# Patient Record
Sex: Female | Born: 1975 | Race: White | Hispanic: No | Smoking: Never smoker
Health system: Southern US, Community
[De-identification: ages and names within clinical notes are randomized; demographics above are authoritative.]

## PROBLEM LIST (undated history)

## (undated) DIAGNOSIS — F419 Anxiety disorder, unspecified: Secondary | ICD-10-CM

## (undated) DIAGNOSIS — C801 Malignant (primary) neoplasm, unspecified: Secondary | ICD-10-CM

## (undated) DIAGNOSIS — I10 Essential (primary) hypertension: Secondary | ICD-10-CM

## (undated) DIAGNOSIS — T7840XA Allergy, unspecified, initial encounter: Secondary | ICD-10-CM

## (undated) DIAGNOSIS — F32A Depression, unspecified: Secondary | ICD-10-CM

## (undated) DIAGNOSIS — K219 Gastro-esophageal reflux disease without esophagitis: Secondary | ICD-10-CM

## (undated) HISTORY — DX: Anxiety disorder, unspecified: F41.9

## (undated) HISTORY — PX: DILATION AND CURETTAGE OF UTERUS: SHX78

## (undated) HISTORY — DX: Allergy, unspecified, initial encounter: T78.40XA

## (undated) HISTORY — DX: Depression, unspecified: F32.A

## (undated) HISTORY — DX: Essential (primary) hypertension: I10

## (undated) HISTORY — DX: Malignant (primary) neoplasm, unspecified: C80.1

## (undated) HISTORY — DX: Gastro-esophageal reflux disease without esophagitis: K21.9

## (undated) HISTORY — PX: TONSILLECTOMY: SUR1361

---

## 2020-07-14 DIAGNOSIS — R000 Tachycardia, unspecified: Secondary | ICD-10-CM | POA: Insufficient documentation

## 2021-10-25 ENCOUNTER — Other Ambulatory Visit: Payer: Self-pay

## 2021-10-25 ENCOUNTER — Emergency Department (HOSPITAL_BASED_OUTPATIENT_CLINIC_OR_DEPARTMENT_OTHER): Payer: 59

## 2021-10-25 ENCOUNTER — Encounter (HOSPITAL_BASED_OUTPATIENT_CLINIC_OR_DEPARTMENT_OTHER): Payer: Self-pay

## 2021-10-25 ENCOUNTER — Emergency Department (HOSPITAL_BASED_OUTPATIENT_CLINIC_OR_DEPARTMENT_OTHER)
Admission: EM | Admit: 2021-10-25 | Discharge: 2021-10-25 | Disposition: A | Discharge status: Home / Self Care | Payer: 59 | Attending: Emergency Medicine | Admitting: Emergency Medicine

## 2021-10-25 DIAGNOSIS — R0789 Other chest pain: Secondary | ICD-10-CM | POA: Diagnosis not present

## 2021-10-25 DIAGNOSIS — R002 Palpitations: Secondary | ICD-10-CM | POA: Insufficient documentation

## 2021-10-25 LAB — CBC
HCT: 37.5 % (ref 36.0–46.0)
Hemoglobin: 12.7 g/dL (ref 12.0–15.0)
MCH: 30.3 pg (ref 26.0–34.0)
MCHC: 33.9 g/dL (ref 30.0–36.0)
MCV: 89.5 fL (ref 80.0–100.0)
Platelets: 373 10*3/uL (ref 150–400)
RBC: 4.19 MIL/uL (ref 3.87–5.11)
RDW: 12.1 % (ref 11.5–15.5)
WBC: 5.5 10*3/uL (ref 4.0–10.5)
nRBC: 0 % (ref 0.0–0.2)

## 2021-10-25 LAB — BASIC METABOLIC PANEL
Anion gap: 5 (ref 5–15)
BUN: 8 mg/dL (ref 6–20)
CO2: 28 mmol/L (ref 22–32)
Calcium: 8.8 mg/dL — ABNORMAL LOW (ref 8.9–10.3)
Chloride: 104 mmol/L (ref 98–111)
Creatinine, Ser: 0.65 mg/dL (ref 0.44–1.00)
GFR, Estimated: >60 mL/min (ref >60)
Glucose, Bld: 99 mg/dL (ref 70–99)
Potassium: 3.9 mmol/L (ref 3.5–5.1)
Sodium: 137 mmol/L (ref 135–145)

## 2021-10-25 LAB — TROPONIN I (HIGH SENSITIVITY)
Troponin I (High Sensitivity): 4 ng/L (ref <18)
Troponin I (High Sensitivity): 4 ng/L (ref <18)

## 2021-10-25 LAB — PREGNANCY, URINE: Preg Test, Ur: NEGATIVE

## 2021-10-25 MED ORDER — HYDROXYZINE HCL 25 MG PO TABS: 25.0000 mg | ORAL_TABLET | Freq: Four times a day (QID) | ORAL | 0 refills | Status: DC

## 2021-10-25 NOTE — ED Triage Notes (Signed)
Pt states has been having tachycardia for the past year. States has also been hypertensive the past few weeks. Recently moved from [place], has appt with PCP in December. C/o dyspnea, palpitations, left arm/neck pain.

## 2021-10-25 NOTE — ED Provider Notes (Signed)
[facility] EMERGENCY DEPARTMENT Provider Note   CSN: [id number] Arrival date & time: 10/25/21  1556     History Chief Complaint  Patient presents with   Palpitations    Tiffany Zimmerman is a 45 y.o. female.  HPI Patient is a 44 year old female with past medical history significant for depression on Effexor  Patient is presented to the ER today with complaints of episodic palpitations she states that she has had the symptoms for over a year she states that she was evaluated by a doctor in [place] where she just moved from however she is recently relocated to [facility].  She states that she has these episodes every few months.  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  States that she did feel somewhat lightheaded but did not feel that she was about to pass out.  She states she has no symptoms currently.  She states her symptoms lasted for 10 to 15 minutes during this episode.  She has had similar episodes in the past.  She has a appointment in December to see a primary care provider about the symptoms.  She states that she last had her thyroid levels checked approximately 2 or 3 years ago.  They were normal at that time.  She does state that she feels that her heart rate has been as high as 112 over the past year relatively consistently.  She states that this fast heart rate seems to be associated with some anxiety although she is not sure if it is causing it or the result of it.    History reviewed. No pertinent past medical history.  There are no problems to display for this patient.   Past Surgical History:  Procedure Laterality Date   DILATION AND CURETTAGE OF UTERUS     TONSILLECTOMY       OB History   No obstetric history on file.     History reviewed. No pertinent family  history.  Social History   Tobacco Use   Smoking status: Never   Smokeless tobacco: Never  Vaping Use   Vaping Use: Never used  Substance Use Topics   Alcohol use: Yes    Comment: occassional   Drug use: Never    Home Medications Prior to Admission medications   Medication Sig Start Date End Date Taking? Authorizing Provider  hydrOXYzine (ATARAX/VISTARIL) 25 MG tablet Take 1 tablet (25 mg total) by mouth every 6 (six) hours for 21 days. 10/25/21 11/15/21 Yes Tedd Sias, PA    Allergies    Patient has no known allergies.  Review of Systems   Review of Systems  Constitutional:  Positive for fatigue. Negative for chills and fever.  HENT:  Negative for congestion.   Eyes:  Negative for pain.  Respiratory:  Positive for shortness of breath. Negative for cough.   Cardiovascular:  Positive for palpitations. Negative for chest pain and leg swelling.  Gastrointestinal:  Negative for abdominal pain and vomiting.  Genitourinary:  Negative for dysuria.  Musculoskeletal:  Negative for myalgias.  Skin:  Negative for rash.  Neurological:  Negative for dizziness and headaches.   Physical Exam Updated Vital Signs BP 131/87   Pulse 86   Temp 97.7 F (36.5 C) (Oral)   Resp 16   Ht 5\' 2"  (1.575 m)   Wt 95.3  kg   LMP 10/11/2021 (Approximate)   SpO2 100%   BMI 38.41 kg/m   Physical Exam Vitals and nursing note reviewed.  Constitutional:      General: She is not in acute distress.    Comments: Pleasant well-appearing 45 year old.  In no acute distress.  Sitting comfortably in bed.  Able answer questions appropriately follow commands. No increased work of breathing. Speaking in full sentences.   HENT:     Head: Normocephalic and atraumatic.     Nose: Nose normal.  Eyes:     General: No scleral icterus. Cardiovascular:     Rate and Rhythm: Normal rate and regular rhythm.     Pulses: Normal pulses.     Heart sounds: Normal heart sounds.     Comments: RRR, no  MRG Pulmonary:     Effort: Pulmonary effort is normal. No respiratory distress.     Breath sounds: No wheezing.  Abdominal:     Palpations: Abdomen is soft.     Tenderness: There is no abdominal tenderness. There is no guarding or rebound.  Musculoskeletal:     Cervical back: Normal range of motion.     Right lower leg: No edema.     Left lower leg: No edema.     Comments: No lower extremity edema or calf tenderness  Skin:    General: Skin is warm and dry.     Capillary Refill: Capillary refill takes less than 2 seconds.  Neurological:     Mental Status: She is alert. Mental status is at baseline.  Psychiatric:        Mood and Affect: Mood normal.        Behavior: Behavior normal.    ED Results / Procedures / Treatments   Labs (all labs ordered are listed, but only abnormal results are displayed) Labs Reviewed  BASIC METABOLIC PANEL - Abnormal; Notable for the following components:      Result Value   Calcium 8.8 (*)    All other components within normal limits  CBC  PREGNANCY, URINE  TROPONIN I (HIGH SENSITIVITY)  TROPONIN I (HIGH SENSITIVITY)    EKG EKG Interpretation  Date/Time:  Thursday October 25 2021 16:06:11 EST Ventricular Rate:  94 PR Interval:  136 QRS Duration: 82 QT Interval:  344 QTC Calculation: 430 R Axis:   51 Text Interpretation: Normal sinus rhythm Cannot rule out Anterior infarct , age undetermined No previous tracing Confirmed by Blanchie Dessert [phone]) on 10/25/2021 5:40:42 PM  Radiology DG Chest 2 View  Result Date: 10/25/2021 CLINICAL DATA:  Dyspnea EXAM: CHEST - 2 VIEW COMPARISON:  None. FINDINGS: The heart size and mediastinal contours are within normal limits. Both lungs are clear. The visualized skeletal structures are unremarkable. IMPRESSION: No acute abnormality of the lungs. Electronically Signed   By: Delanna Ahmadi M.D.   On: 10/25/2021 16:46    Procedures Procedures   Medications Ordered in ED Medications - No data to  display  ED Course  I have reviewed the triage vital signs and the nursing notes.  Pertinent labs & imaging results that were available during my care of the patient were reviewed by me and considered in my medical decision making (see chart for details).    MDM Rules/Calculators/A&P                          Patient is a 45 year old female presenting today with complaints of intermittent palpitations, feeling hot, flushed, somewhat fatigued and  having tachycardia over the past year.  She states she had an episode of this this morning when she was working she states she feels that she was very stressed at work prior to her symptom onset  She states she did have some chest tightness and "twinges" in her chest   I personally reviewed all laboratory work and imaging.  Metabolic panel without any acute abnormality specifically kidney function within normal limits and no significant electrolyte abnormalities. CBC without leukocytosis or significant anemia.   Troponin x2 WNLs; CXR WNLs  EKG was normal sinus rhythm.  No significant axis deviations.  No evidence of ischemia.  Reviewed by Dr. Maryan Rued.  As patient has been asymptomatic during her ER visits, well-appearing has vital signs within normal limits plan to discharge home.  She was initially somewhat hypertensive.  Doubt PE as patient denies pleuritic component of CP, denies history of clot, active cancer, immobilization, surgery, hemoptysis, known clotting disorder, is on no estrogen containing medications, denies calf pain and on physical exam has no unilateral leg swelling, calf TTP, tachycardia or hypoxia.   Doubt ACS as patient has (DM, HLD, HTN, obesity, CAD/CVA, family hx, smoking) risk factors. Patient's CP is atypical for ACS. Is non-radiating, not substernal, not described as chest pressure and is not precipitated/worsened by exertion.  Doubt thoracic aortic dissection as pain lacks tearing or severe quality and does not  radiate to back. Patient denies nausea, diaphoresis. Symmetric pulses, no murmur, no neurologic deficit. CXR without widened mediastinum.   Doubt pericarditis as no recent illness, PE without muffled heart sounds or friction rub, CP is non-pleuritic, EKG without global STE or PR depression.  Ultimately overall well-appearing on exam.  Will discharge home at this time.  Return precautions given.  Given Vistaril for home use as needed.  Final Clinical Impression(s) / ED Diagnoses Final diagnoses:  Palpitations  Other chest pain    Rx / DC Orders ED Discharge Orders          Ordered    hydrOXYzine (ATARAX/VISTARIL) 25 MG tablet  Every 6 hours        10/25/21 1901             Tedd Sias, [place] 10/25/21 1911    Blanchie Dessert, MD 10/25/21 2236

## 2021-10-25 NOTE — Discharge Instructions (Addendum)
Please follow-up with your primary care provider.  I given you a work note for the weekend so you may rest tomorrow.  Drink plenty of water.  I have written your prescription for a medication called hydroxyzine that you may take in addition to the Effexor that you are taking.  This can help with some situational anxiety in case this is causing some of your symptoms.  It is a relatively benign medication a few side effects although does cause some dry mouth you may choose to take it or not take it.

## 2021-10-25 NOTE — ED Notes (Signed)
ED Provider at bedside. 

## 2021-11-14 ENCOUNTER — Ambulatory Visit (INDEPENDENT_AMBULATORY_CARE_PROVIDER_SITE_OTHER): Payer: 59 | Admitting: Nurse Practitioner

## 2021-11-14 ENCOUNTER — Other Ambulatory Visit: Payer: Self-pay

## 2021-11-14 ENCOUNTER — Encounter: Payer: Self-pay | Admitting: Nurse Practitioner

## 2021-11-14 VITALS — BP 144/94 | HR 101 | Temp 96.9°F | Ht 63.75 in | Wt 207.8 lb

## 2021-11-14 DIAGNOSIS — I10 Essential (primary) hypertension: Secondary | ICD-10-CM | POA: Diagnosis not present

## 2021-11-14 DIAGNOSIS — R002 Palpitations: Secondary | ICD-10-CM | POA: Diagnosis not present

## 2021-11-14 DIAGNOSIS — Z86006 Personal history of melanoma in-situ: Secondary | ICD-10-CM | POA: Insufficient documentation

## 2021-11-14 DIAGNOSIS — Z23 Encounter for immunization: Secondary | ICD-10-CM

## 2021-11-14 DIAGNOSIS — F3341 Major depressive disorder, recurrent, in partial remission: Secondary | ICD-10-CM

## 2021-11-14 DIAGNOSIS — F32A Depression, unspecified: Secondary | ICD-10-CM | POA: Insufficient documentation

## 2021-11-14 MED ORDER — AMLODIPINE BESYLATE 5 MG PO TABS: 5.0000 mg | ORAL_TABLET | Freq: Every day | ORAL | 5 refills | Status: DC

## 2021-11-14 NOTE — Assessment & Plan Note (Signed)
First noticed 73yr ago, associated with intermittent palpitations Home BP: 150/106, 140/90 Fhx of HTN (mother). 1cup of coffee, 20oz starbucks cold brew, coke zero 1can per day. ETOh: social use, 1xmonth No tobacco use. No marijuana use. She is not sexually active, HCG test completed 10/25/21 (negative) CBC and BMP completed in ED on 10/25/21: normal.  Start DASH diet and daily exercise. Decrease caffeine. Maintain adequate oral hydration Start amlodipine 5mg  at bedtime Monitor BP in AM 2-3x/week. F/up in 25month

## 2021-11-14 NOTE — Assessment & Plan Note (Signed)
Chronic, waxing and waning. Current Use of effexor for anxiety and depression 150mg  x 52yr. Previous use of zoloft, d/c due to lack of improvement.   Maintain current dose F/up in 52month

## 2021-11-14 NOTE — Patient Instructions (Addendum)
Thank you for choosing [facility]  Sign medical release form to get your records from previous providers.  Start DASH diet and daily exercise. Decrease caffeine. Maintain adequate oral hydration  Start amlodipine 5mg  at bedtime Monitor BP in AM 2-3x/week.  DASH Eating Plan DASH stands for Dietary Approaches to Stop Hypertension. The DASH eating plan is a healthy eating plan that has been shown to: Reduce high blood pressure (hypertension). Reduce your risk for type 2 diabetes, heart disease, and stroke. Help with weight loss. What are tips for following this plan? Reading food labels Check food labels for the amount of salt (sodium) per serving. Choose foods with less than 5 percent of the Daily Value of sodium. Generally, foods with less than 300 milligrams (mg) of sodium per serving fit into this eating plan. To find whole grains, look for the word "whole" as the first word in the ingredient list. Shopping Buy products labeled as "low-sodium" or "no salt added." Buy fresh foods. Avoid canned foods and pre-made or frozen meals. Cooking Avoid adding salt when cooking. Use salt-free seasonings or herbs instead of table salt or sea salt. Check with your health care provider or pharmacist before using salt substitutes. Do not fry foods. Cook foods using healthy methods such as baking, boiling, grilling, roasting, and broiling instead. Cook with heart-healthy oils, such as olive, canola, avocado, soybean, or sunflower oil. Meal planning  Eat a balanced diet that includes: 4 or more servings of fruits and 4 or more servings of vegetables each day. Try to fill one-half of your plate with fruits and vegetables. 6-8 servings of whole grains each day. Less than 6 oz (170 g) of lean meat, poultry, or fish each day. A 3-oz (85-g) serving of meat is about the same size as a deck of cards. One egg equals 1 oz (28 g). 2-3 servings of low-fat dairy each day. One serving is 1 cup (237  mL). 1 serving of nuts, seeds, or beans 5 times each week. 2-3 servings of heart-healthy fats. Healthy fats called omega-3 fatty acids are found in foods such as walnuts, flaxseeds, fortified milks, and eggs. These fats are also found in cold-water fish, such as sardines, salmon, and mackerel. Limit how much you eat of: Canned or prepackaged foods. Food that is high in trans fat, such as some fried foods. Food that is high in saturated fat, such as fatty meat. Desserts and other sweets, sugary drinks, and other foods with added sugar. Full-fat dairy products. Do not salt foods before eating. Do not eat more than 4 egg yolks a week. Try to eat at least 2 vegetarian meals a week. Eat more home-cooked food and less restaurant, buffet, and fast food. Lifestyle When eating at a restaurant, ask that your food be prepared with less salt or no salt, if possible. If you drink alcohol: Limit how much you use to: 0-1 drink a day for women who are not pregnant. 0-2 drinks a day for men. Be aware of how much alcohol is in your drink. In the [place] one drink equals one 12 oz bottle of beer (355 mL), one 5 oz glass of wine (148 mL), or one 1 oz glass of hard liquor (44 mL). General information Avoid eating more than 2,300 mg of salt a day. If you have hypertension, you may need to reduce your sodium intake to 1,500 mg a day. Work with your health care provider to maintain a healthy body weight or to lose weight. Ask  what an ideal weight is for you. Get at least 30 minutes of exercise that causes your heart to beat faster (aerobic exercise) most days of the week. Activities may include walking, swimming, or biking. Work with your health care provider or dietitian to adjust your eating plan to your individual calorie needs. What foods should I eat? Fruits All fresh, dried, or frozen fruit. Canned fruit in natural juice (without added sugar). Vegetables Fresh or frozen vegetables (raw, steamed, roasted,  or grilled). Low-sodium or reduced-sodium tomato and vegetable juice. Low-sodium or reduced-sodium tomato sauce and tomato paste. Low-sodium or reduced-sodium canned vegetables. Grains Whole-grain or whole-wheat bread. Whole-grain or whole-wheat pasta. Brown rice. [place]. Bulgur. Whole-grain and low-sodium cereals. Pita bread. Low-fat, low-sodium crackers. Whole-wheat flour tortillas. Meats and other proteins Skinless chicken or [place]. Ground chicken or [place]. Pork with fat trimmed off. Fish and seafood. Egg whites. Dried beans, peas, or lentils. Unsalted nuts, nut butters, and seeds. Unsalted canned beans. Lean cuts of beef with fat trimmed off. Low-sodium, lean precooked or cured meat, such as sausages or meat loaves. Dairy Low-fat (1%) or fat-free (skim) milk. Reduced-fat, low-fat, or fat-free cheeses. Nonfat, low-sodium ricotta or cottage cheese. Low-fat or nonfat yogurt. Low-fat, low-sodium cheese. Fats and oils Soft margarine without trans fats. Vegetable oil. Reduced-fat, low-fat, or light mayonnaise and salad dressings (reduced-sodium). Canola, safflower, olive, avocado, soybean, and sunflower oils. Avocado. Seasonings and condiments Herbs. Spices. Seasoning mixes without salt. Other foods Unsalted popcorn and pretzels. Fat-free sweets. The items listed above may not be a complete list of foods and beverages you can eat. Contact a dietitian for more information. What foods should I avoid? Fruits Canned fruit in a light or heavy syrup. Fried fruit. Fruit in cream or butter sauce. Vegetables Creamed or fried vegetables. Vegetables in a cheese sauce. Regular canned vegetables (not low-sodium or reduced-sodium). Regular canned tomato sauce and paste (not low-sodium or reduced-sodium). Regular tomato and vegetable juice (not low-sodium or reduced-sodium). Angie Fava. Olives. Grains Baked goods made with fat, such as croissants, muffins, or some breads. Dry pasta or rice meal  packs. Meats and other proteins Fatty cuts of meat. Ribs. Fried meat. Berniece Salines. Bologna, salami, and other precooked or cured meats, such as sausages or meat loaves. Fat from the back of a pig (fatback). Bratwurst. Salted nuts and seeds. Canned beans with added salt. Canned or smoked fish. Whole eggs or egg yolks. Chicken or [place] with skin. Dairy Whole or 2% milk, cream, and half-and-half. Whole or full-fat cream cheese. Whole-fat or sweetened yogurt. Full-fat cheese. Nondairy creamers. Whipped toppings. Processed cheese and cheese spreads. Fats and oils Butter. Stick margarine. Lard. Shortening. Ghee. Bacon fat. Tropical oils, such as coconut, palm kernel, or palm oil. Seasonings and condiments Onion salt, garlic salt, seasoned salt, table salt, and sea salt. Worcestershire sauce. Tartar sauce. Barbecue sauce. Teriyaki sauce. Soy sauce, including reduced-sodium. Steak sauce. Canned and packaged gravies. Fish sauce. Oyster sauce. Cocktail sauce. Store-bought horseradish. Ketchup. Mustard. Meat flavorings and tenderizers. Bouillon cubes. Hot sauces. Pre-made or packaged marinades. Pre-made or packaged taco seasonings. Relishes. Regular salad dressings. Other foods Salted popcorn and pretzels. The items listed above may not be a complete list of foods and beverages you should avoid. Contact a dietitian for more information. Where to find more information National Heart, Lung, and Blood Institute: https://wilson-eaton.com/ American Heart Association: www.heart.org Academy of Nutrition and Dietetics: www.eatright.[place]: www.kidney.org Summary The DASH eating plan is a healthy eating plan that has been shown to reduce  high blood pressure (hypertension). It may also reduce your risk for type 2 diabetes, heart disease, and stroke. When on the DASH eating plan, aim to eat more fresh fruits and vegetables, whole grains, lean proteins, low-fat dairy, and heart-healthy fats. With the DASH  eating plan, you should limit salt (sodium) intake to 2,300 mg a day. If you have hypertension, you may need to reduce your sodium intake to 1,500 mg a day. Work with your health care provider or dietitian to adjust your eating plan to your individual calorie needs. This information is not intended to replace advice given to you by your health care provider. Make sure you discuss any questions you have with your health care provider. Document Revised: 10/29/2019 Document Reviewed: 10/29/2019 Elsevier Patient Education  2022 [place].  How to Increase Your Level of Physical Activity Getting regular physical activity is important for your overall health and well-being. Most people do not get enough exercise. There are easy ways to increase your level of physical activity, even if you have not been very active in the past or if you are just starting out. What are the benefits of physical activity? Physical activity has many short-term and long-term benefits. Being active on a regular basis can improve your physical and mental health as well as provide other benefits. Physical health benefits Helping you lose weight or maintain a healthy weight. Strengthening your muscles and bones. Reducing your risk of certain long-term (chronic) diseases, including heart disease, cancer, and diabetes. Being able to move around more easily and for longer periods of time without getting tired (increased endurance or stamina). Improving your ability to fight off illness (enhanced immunity). Being able to sleep better. Helping you stay healthy as you get older, including: Helping you stay mobile, or capable of walking and moving around. Preventing accidents, such as falls. Increasing life expectancy. Mental health benefits Boosting your mood and improving your self-esteem. Lowering your chance of having mental health problems, such as depression or anxiety. Helping you feel good about your body. Other  benefits Finding new sources of fun and enjoyment. Meeting new people who share a common interest. Before you begin If you have a chronic illness or have not been active for a while, check with your health care provider about how to get started. Ask your health care provider what activities are safe for you. Start out slowly. Walking or doing some simple chair exercises is a good place to start, especially if you have not been active before or for a long time. Set goals that you can work toward. Ask your health care provider how much exercise is best for you. In general, most adults should: Do moderate-intensity exercise for at least 150 minutes each week (30 minutes on most days of the week) or vigorous exercise for at least 75 minutes each week, or a combination of these. Moderate-intensity exercise can include walking at a quick pace, biking, yoga, water aerobics, or gardening. Vigorous exercise involves activities that take more effort, such as jogging or running, playing sports, swimming laps, or jumping rope. Do strength exercises on at least 2 days each week. This can include weight lifting, body weight exercises, and resistance-band exercises. How to be more physically active Make a plan  Try to find activities that you enjoy. You are more likely to commit to an exercise routine if it does not feel like a chore. If you have bone or joint problems, choose low-impact exercises, like walking or swimming. Use these tips  for being successful with an exercise plan: Find a workout partner for accountability. Join a group or class, such as an aerobics class, cycling class, or sports team. Make family time active. Go for a walk, bike, or swim. Include a variety of exercises each week. Consider using a fitness tracker, such as a mobile phone app or a device worn like a watch, that will count the number of steps you take each day. Many people strive to reach 10,000 steps a day. Find ways to be  active in your daily routines Besides your formal exercise plans, you can find ways to do physical activity during your daily routines, such as: Walking or biking to work or to the store. Taking the stairs instead of the elevator. Parking farther away from the door at work or at the store. Planning walking meetings. Walking around while you are on the phone. Where to find more information Centers for Disease Control and Prevention: WorkDashboard.es President's Council on Fitness, Sports & Nutrition: www.fitness.gov ChooseMyPlate: MassVoice.es Contact a health care provider if: You have headaches, muscle aches, or joint pain that is concerning. You feel dizzy or light-headed while exercising. You faint. You feel your heart skipping, racing, or fluttering. You have chest pain while exercising. Summary Exercise benefits your mind and body at any age, even if you are just starting out. If you have a chronic illness or have not been active for a while, check with your health care provider before increasing your physical activity. Choose activities that are safe and enjoyable for you. Ask your health care provider what activities are safe for you. Start slowly. Tell your health care provider if you have problems as you start to increase your activity level. This information is not intended to replace advice given to you by your health care provider. Make sure you discuss any questions you have with your health care provider. Document Revised: 03/23/2021 Document Reviewed: 03/23/2021 Elsevier Patient Education  [place].

## 2021-11-14 NOTE — Assessment & Plan Note (Addendum)
Chronic intermittent palpitation: onset 07/2020, highest rate at 180 with activity. Had several ED visit in [place] for palpitations, had 1ED visit in [place](normal ECG, CBC. BMP, and cardiac enzymes) [facility] [place] in [place] 12/2019 (normal labs per patient) Drinks 20oz starbucks cold brew per day, and coke zero 1can per day. ETOh: social use, 1xmonth No tobacco use.  Advised to decrease or eliminate caffeine intake and maintain adequate oral hydration

## 2021-11-14 NOTE — Progress Notes (Signed)
Subjective:  Patient ID: Tiffany Zimmerman, female    DOB: 02-26-1976  Age: 45 y.o. MRN: [id number]  CC: Establish Care (New patient/Medication refill needed and would like to discuss elevated BP. Pt states she went to the ED regarding the elevated BP and heart rate and was informed she may have anxiety but she wants second opinion. )  HPI Moved from [facility] to [place]. Last appt with pcp 12/2019.  Primary hypertension First noticed 24yr ago, associated with intermittent palpitations Home BP: 150/106, 140/90 Fhx of HTN (mother). 1cup of coffee, 20oz starbucks cold brew, coke zero 1can per day. ETOh: social use, 1xmonth No tobacco use. No marijuana use. She is not sexually active, HCG test completed 10/25/21 (negative) CBC and BMP completed in ED on 10/25/21: normal.  Start DASH diet and daily exercise. Decrease caffeine. Maintain adequate oral hydration Start amlodipine 5mg  at bedtime Monitor BP in AM 2-3x/week. F/up in 56month  Intermittent palpitations Chronic intermittent palpitation: onset 07/2020, highest rate at 180 with activity. Had several ED visit in [place] for palpitations, had 1ED visit in [place](normal ECG, CBC. BMP, and cardiac enzymes) [facility] CPE with pcp in [place] 12/2019 (normal labs per patient) Drinks 20oz starbucks cold brew per day, and coke zero 1can per day. ETOh: social use, 1xmonth No tobacco use.  Advised to decrease or eliminate caffeine intake and maintain adequate oral hydration  Depression Chronic, waxing and waning. Current Use of effexor for anxiety and depression 150mg  x 68yr. Previous use of zoloft, d/c due to lack of improvement.   Maintain current dose F/up in 22month  Reviewed past Medical, Social and Family history today.  Outpatient Medications Prior to Visit  Medication Sig Dispense Refill   venlafaxine XR (EFFEXOR-XR) 150 MG 24 hr capsule Take 150 mg by mouth.     hydrOXYzine (ATARAX/VISTARIL) 25 MG tablet Take 1 tablet (25 mg total) by mouth every 6  (six) hours for 21 days. (Patient not taking: Reported on 11/14/2021) 84 tablet 0   No facility-administered medications prior to visit.    ROS See HPI  Objective:  BP (!) 144/94 (BP Location: Left Arm, Patient Position: Sitting, Cuff Size: Large)   Pulse (!) 101   Temp (!) 96.9 F (36.1 C) (Temporal)   Ht 5' 3.75" (1.619 m)   Wt 207 lb 12.8 oz (94.3 kg)   SpO2 98%   BMI 35.95 kg/m   Physical Exam Vitals reviewed.  Neck:     Thyroid: No thyroid mass, thyromegaly or thyroid tenderness.  Cardiovascular:     Rate and Rhythm: Normal rate and regular rhythm.     Pulses: Normal pulses.     Heart sounds: Normal heart sounds.  Pulmonary:     Effort: Pulmonary effort is normal.     Breath sounds: Normal breath sounds.  Musculoskeletal:     Right lower leg: No edema.     Left lower leg: No edema.  Lymphadenopathy:     Cervical: No cervical adenopathy.  Neurological:     Mental Status: She is alert and oriented to person, place, and time.    Assessment & Plan:  This visit occurred during the SARS-CoV-2 public health emergency.  Safety protocols were in place, including screening questions prior to the visit, additional usage of staff PPE, and extensive cleaning of exam room while observing appropriate contact time as indicated for disinfecting solutions.   Tiffany Zimmerman was seen today for establish care.  Diagnoses and all orders for this visit:  Primary hypertension -  amLODipine (NORVASC) 5 MG tablet; Take 1 tablet (5 mg total) by mouth at bedtime.  Need for diphtheria-tetanus-pertussis (Tdap) vaccine -     Tdap vaccine greater than or equal to 7yo IM  Intermittent palpitations  Recurrent major depressive disorder, in partial remission ([place])   Problem List Items Addressed This Visit       Cardiovascular and Mediastinum   Primary hypertension - Primary    First noticed 36yr ago, associated with intermittent palpitations Home BP: 150/106, 140/90 Fhx of HTN (mother). 1cup  of coffee, 20oz starbucks cold brew, coke zero 1can per day. ETOh: social use, 1xmonth No tobacco use. No marijuana use. She is not sexually active, HCG test completed 10/25/21 (negative) CBC and BMP completed in ED on 10/25/21: normal.  Start DASH diet and daily exercise. Decrease caffeine. Maintain adequate oral hydration Start amlodipine 5mg  at bedtime Monitor BP in AM 2-3x/week. F/up in 90month      Relevant Medications   amLODipine (NORVASC) 5 MG tablet     Other   Depression    Chronic, waxing and waning. Current Use of effexor for anxiety and depression 150mg  x 85yr. Previous use of zoloft, d/c due to lack of improvement.   Maintain current dose F/up in 34month      Relevant Medications   venlafaxine XR (EFFEXOR-XR) 150 MG 24 hr capsule   Intermittent palpitations    Chronic intermittent palpitation: onset 07/2020, highest rate at 180 with activity. Had several ED visit in [place] for palpitations, had 1ED visit in [place](normal ECG, CBC. BMP, and cardiac enzymes) [facility] CPE with pcp in [place] 12/2019 (normal labs per patient) Drinks 20oz starbucks cold brew per day, and coke zero 1can per day. ETOh: social use, 1xmonth No tobacco use.  Advised to decrease or eliminate caffeine intake and maintain adequate oral hydration      Other Visit Diagnoses     Need for diphtheria-tetanus-pertussis (Tdap) vaccine       Relevant Orders   Tdap vaccine greater than or equal to 7yo IM (Completed)       Follow-up: Return in about 4 weeks (around 12/12/2021) for CPE and HTN f/up (fasting, breast and pelvic exam).  Wilfred Lacy, NP

## 2022-01-01 ENCOUNTER — Ambulatory Visit: Payer: Self-pay | Admitting: Nurse Practitioner

## 2022-01-03 ENCOUNTER — Other Ambulatory Visit: Payer: Self-pay

## 2022-01-03 ENCOUNTER — Encounter: Payer: Self-pay | Admitting: Nurse Practitioner

## 2022-01-03 ENCOUNTER — Ambulatory Visit (INDEPENDENT_AMBULATORY_CARE_PROVIDER_SITE_OTHER): Payer: 59 | Admitting: Nurse Practitioner

## 2022-01-03 ENCOUNTER — Other Ambulatory Visit (HOSPITAL_COMMUNITY)
Admission: RE | Admit: 2022-01-03 | Discharge: 2022-01-03 | Disposition: A | Discharge status: Home / Self Care | Payer: 59 | Source: Ambulatory Visit | Attending: Nurse Practitioner | Admitting: Nurse Practitioner

## 2022-01-03 VITALS — BP 138/90 | HR 90 | Temp 98.7°F | Resp 18 | Ht 62.0 in | Wt 200.0 lb

## 2022-01-03 DIAGNOSIS — Z1231 Encounter for screening mammogram for malignant neoplasm of breast: Secondary | ICD-10-CM

## 2022-01-03 DIAGNOSIS — Z136 Encounter for screening for cardiovascular disorders: Secondary | ICD-10-CM

## 2022-01-03 DIAGNOSIS — Z1211 Encounter for screening for malignant neoplasm of colon: Secondary | ICD-10-CM

## 2022-01-03 DIAGNOSIS — F3341 Major depressive disorder, recurrent, in partial remission: Secondary | ICD-10-CM

## 2022-01-03 DIAGNOSIS — Z124 Encounter for screening for malignant neoplasm of cervix: Secondary | ICD-10-CM | POA: Insufficient documentation

## 2022-01-03 DIAGNOSIS — Z0001 Encounter for general adult medical examination with abnormal findings: Secondary | ICD-10-CM | POA: Diagnosis present

## 2022-01-03 DIAGNOSIS — Z13220 Encounter for screening for lipoid disorders: Secondary | ICD-10-CM | POA: Diagnosis not present

## 2022-01-03 DIAGNOSIS — I10 Essential (primary) hypertension: Secondary | ICD-10-CM

## 2022-01-03 DIAGNOSIS — Z8041 Family history of malignant neoplasm of ovary: Secondary | ICD-10-CM

## 2022-01-03 LAB — CBC WITH DIFFERENTIAL/PLATELET
Basophils Absolute: 0 10*3/uL (ref 0.0–0.1)
Basophils Relative: 0.1 % (ref 0.0–3.0)
Eosinophils Absolute: 0 10*3/uL (ref 0.0–0.7)
Eosinophils Relative: 0 % (ref 0.0–5.0)
HCT: 37.9 % (ref 36.0–46.0)
Hemoglobin: 12.6 g/dL (ref 12.0–15.0)
Lymphocytes Relative: 33.4 % (ref 12.0–46.0)
Lymphs Abs: 1.5 10*3/uL (ref 0.7–4.0)
MCHC: 33.2 g/dL (ref 30.0–36.0)
MCV: 87.7 fl (ref 78.0–100.0)
Monocytes Absolute: 0.4 10*3/uL (ref 0.1–1.0)
Monocytes Relative: 8.6 % (ref 3.0–12.0)
Neutro Abs: 2.6 10*3/uL (ref 1.4–7.7)
Neutrophils Relative %: 57.9 % (ref 43.0–77.0)
Platelets: 342 10*3/uL (ref 150.0–400.0)
RBC: 4.32 Mil/uL (ref 3.87–5.11)
RDW: 12.8 % (ref 11.5–15.5)
WBC: 4.6 10*3/uL (ref 4.0–10.5)

## 2022-01-03 LAB — COMPREHENSIVE METABOLIC PANEL
ALT: 25 U/L (ref 0–35)
AST: 23 U/L (ref 0–37)
Albumin: 4.1 g/dL (ref 3.5–5.2)
Alkaline Phosphatase: 70 U/L (ref 39–117)
BUN: 10 mg/dL (ref 6–23)
CO2: 26 mEq/L (ref 19–32)
Calcium: 9.2 mg/dL (ref 8.4–10.5)
Chloride: 102 mEq/L (ref 96–112)
Creatinine, Ser: 0.72 mg/dL (ref 0.40–1.20)
GFR: 100.99 mL/min (ref >60.00)
Glucose, Bld: 92 mg/dL (ref 70–99)
Potassium: 4 mEq/L (ref 3.5–5.1)
Sodium: 136 mEq/L (ref 135–145)
Total Bilirubin: 0.4 mg/dL (ref 0.2–1.2)
Total Protein: 7.4 g/dL (ref 6.0–8.3)

## 2022-01-03 LAB — TSH: TSH: 0.71 u[IU]/mL (ref 0.35–5.50)

## 2022-01-03 LAB — LIPID PANEL
Cholesterol: 200 mg/dL (ref 0–200)
HDL: 52.9 mg/dL (ref >39.00)
LDL Cholesterol: 127 mg/dL — ABNORMAL HIGH (ref 0–99)
NonHDL: 147.59
Total CHOL/HDL Ratio: 4
Triglycerides: 105 mg/dL (ref 0.0–149.0)
VLDL: 21 mg/dL (ref 0.0–40.0)

## 2022-01-03 MED ORDER — VENLAFAXINE HCL ER 150 MG PO CP24: 150.0000 mg | ORAL_CAPSULE | Freq: Every day | ORAL | 3 refills | Status: DC

## 2022-01-03 MED ORDER — AMLODIPINE BESYLATE 5 MG PO TABS: 5.0000 mg | ORAL_TABLET | Freq: Every day | ORAL | 3 refills | Status: DC

## 2022-01-03 NOTE — Assessment & Plan Note (Signed)
Stable with effexor Refill sent

## 2022-01-03 NOTE — Assessment & Plan Note (Signed)
Denies any pelvic pain Mild dysmenorrhea on first 2days of menstrual cycle No hx of dyspareunia.

## 2022-01-03 NOTE — Patient Instructions (Signed)
Maintain DASH diet and regular exercise Go to lab for blood draw. You will be contacted to schedule appt for mammogram and with GI  Preventive Care 37-46 Years Old, Female Preventive care refers to lifestyle choices and visits with your health care provider that can promote health and wellness. Preventive care visits are also called wellness exams. What can I expect for my preventive care visit? Counseling Your health care provider may ask you questions about your: Medical history, including: Past medical problems. Family medical history. Pregnancy history. Current health, including: Menstrual cycle. Method of birth control. Emotional well-being. Home life and relationship well-being. Sexual activity and sexual health. Lifestyle, including: Alcohol, nicotine or tobacco, and drug use. Access to firearms. Diet, exercise, and sleep habits. Work and work Statistician. Sunscreen use. Safety issues such as seatbelt and bike helmet use. Physical exam Your health care provider will check your: Height and weight. These may be used to calculate your BMI (body mass index). BMI is a measurement that tells if you are at a healthy weight. Waist circumference. This measures the distance around your waistline. This measurement also tells if you are at a healthy weight and may help predict your risk of certain diseases, such as type 2 diabetes and high blood pressure. Heart rate and blood pressure. Body temperature. Skin for abnormal spots. What immunizations do I need? Vaccines are usually given at various ages, according to a schedule. Your health care provider will recommend vaccines for you based on your age, medical history, and lifestyle or other factors, such as travel or where you work. What tests do I need? Screening Your health care provider may recommend screening tests for certain conditions. This may include: Lipid and cholesterol levels. Diabetes screening. This is done by checking  your blood sugar (glucose) after you have not eaten for a while (fasting). Pelvic exam and Pap test. Hepatitis B test. Hepatitis C test. HIV (human immunodeficiency virus) test. STI (sexually transmitted infection) testing, if you are at risk. Lung cancer screening. Colorectal cancer screening. Mammogram. Talk with your health care provider about when you should start having regular mammograms. This may depend on whether you have a family history of breast cancer. BRCA-related cancer screening. This may be done if you have a family history of breast, ovarian, tubal, or peritoneal cancers. Bone density scan. This is done to screen for osteoporosis. Talk with your health care provider about your test results, treatment options, and if necessary, the need for more tests. Follow these instructions at home: Eating and drinking  Eat a diet that includes fresh fruits and vegetables, whole grains, lean protein, and low-fat dairy products. Take vitamin and mineral supplements as recommended by your health care provider. Do not drink alcohol if: Your health care provider tells you not to drink. You are pregnant, may be pregnant, or are planning to become pregnant. If you drink alcohol: Limit how much you have to 0-1 drink a day. Know how much alcohol is in your drink. In the [place] one drink equals one 12 oz bottle of beer (355 mL), one 5 oz glass of wine (148 mL), or one 1 oz glass of hard liquor (44 mL). Lifestyle Brush your teeth every morning and night with fluoride toothpaste. Floss one time each day. Exercise for at least 30 minutes 5 or more days each week. Do not use any products that contain nicotine or tobacco. These products include cigarettes, chewing tobacco, and vaping devices, such as e-cigarettes. If you need help quitting, ask your  health care provider. Do not use drugs. If you are sexually active, practice safe sex. Use a condom or other form of protection to prevent STIs. If you  do not wish to become pregnant, use a form of birth control. If you plan to become pregnant, see your health care provider for a prepregnancy visit. Take aspirin only as told by your health care provider. Make sure that you understand how much to take and what form to take. Work with your health care provider to find out whether it is safe and beneficial for you to take aspirin daily. Find healthy ways to manage stress, such as: Meditation, yoga, or listening to music. Journaling. Talking to a trusted person. Spending time with friends and family. Minimize exposure to UV radiation to reduce your risk of skin cancer. Safety Always wear your seat belt while driving or riding in a vehicle. Do not drive: If you have been drinking alcohol. Do not ride with someone who has been drinking. When you are tired or distracted. While texting. If you have been using any mind-altering substances or drugs. Wear a helmet and other protective equipment during sports activities. If you have firearms in your house, make sure you follow all gun safety procedures. Seek help if you have been physically or sexually abused. What's next? Visit your health care provider once a year for an annual wellness visit. Ask your health care provider how often you should have your eyes and teeth checked. Stay up to date on all vaccines. This information is not intended to replace advice given to you by your health care provider. Make sure you discuss any questions you have with your health care provider. Document Revised: 05/23/2021 Document Reviewed: 05/23/2021 Elsevier Patient Education  [place].

## 2022-01-03 NOTE — Progress Notes (Signed)
Subjective:    Patient ID: Tiffany Zimmerman, female    DOB: 09-Sep-1976, 46 y.o.   MRN: [id number]  Patient presents today for CPE and eval of chronic conditions  HPI Primary hypertension Improved with amlodipine, exercise and low sodium diet BP Readings from Last 3 Encounters:  01/03/22 138/90  11/14/21 (!) 144/94  10/25/21 131/87   Maintain med dose F/up in 71months  Depression Stable with effexor Refill sent  Family history of ovarian cancer Denies any pelvic pain Mild dysmenorrhea on first 2days of menstrual cycle No hx of dyspareunia.  Vision:up to date Dental:up to date Diet:heart healthy Exercise:walking daily Weight:  Wt Readings from Last 3 Encounters:  01/03/22 200 lb (90.7 kg)  11/14/21 207 lb 12.8 oz (94.3 kg)  10/25/21 210 lb (95.3 kg)    Sexual History (orientation,birth control, marital status, STD):no need for STD screen, ok with pelvic and breast exam today  Depression/Suicide: Depression screen PHQ 2/9 11/14/2021  Decreased Interest 1  Down, Depressed, Hopeless 2  PHQ - 2 Score 3  Altered sleeping 2  Tired, decreased energy 2  Change in appetite 1  Feeling bad or failure about yourself  2  Trouble concentrating 1  Moving slowly or fidgety/restless 0  Suicidal thoughts 1  PHQ-9 Score 12  Difficult doing work/chores Somewhat difficult   GAD 7 : Generalized Anxiety Score 11/14/2021  Nervous, Anxious, on Edge 2  Control/stop worrying 2  Worry too much - different things 2  Trouble relaxing 0  Restless 0  Easily annoyed or irritable 0  Afraid - awful might happen 0  Total GAD 7 Score 6  Anxiety Difficulty Somewhat difficult   Immunizations: (TDAP, Hep C screen, Pneumovax, Influenza, zoster)  Health Maintenance  Topic Date Due   Pap Smear  Never done   COVID-19 Vaccine (3 - Moderna risk series) 12/24/2020   Colon Cancer Screening  Never done   Hepatitis C Screening: USPSTF Recommendation to screen - Ages 18-79 yo.  01/03/2023*   HIV Screening   01/03/2023*   Tetanus Vaccine  11/15/2031   Flu Shot  Completed   HPV Vaccine  Aged Out  *Topic was postponed. The date shown is not the original due date.   Fall Risk: Fall Risk  01/03/2022 11/14/2021  Falls in the past year? 0 0  Number falls in past yr: 0 0  Injury with Fall? 0 0  Risk for fall due to : - No Fall Risks  Follow up - Falls evaluation completed   Medications and allergies reviewed with patient and updated if appropriate.  Patient Active Problem List   Diagnosis Date Noted   Family history of ovarian cancer 01/03/2022   History of melanoma in situ 11/14/2021   Depression 11/14/2021   Primary hypertension 11/14/2021   Intermittent palpitations 11/14/2021    No current outpatient medications on file prior to visit.   No current facility-administered medications on file prior to visit.    Past Medical History:  Diagnosis Date   Allergy    Anxiety    Cancer ([place]) 05/2013   Melanoma In Situ   Depression    GERD (gastroesophageal reflux disease)    Hypertension     Past Surgical History:  Procedure Laterality Date   DILATION AND CURETTAGE OF UTERUS     TONSILLECTOMY      Social History   Socioeconomic History   Marital status: Divorced    Spouse name: Not on file   Number of children: Not on file  Years of education: Not on file   Highest education level: Not on file  Occupational History   Not on file  Tobacco Use   Smoking status: Never   Smokeless tobacco: Never  Vaping Use   Vaping Use: Never used  Substance and Sexual Activity   Alcohol use: Not Currently    Comment: occassional   Drug use: Never   Sexual activity: Not Currently    Birth control/protection: Abstinence  Other Topics Concern   Not on file  Social History Narrative   Not on file   Social Determinants of Health   Financial Resource Strain: Not on file  Food Insecurity: Not on file  Transportation Needs: Not on file  Physical Activity: Not on file  Stress: Not on  file  Social Connections: Not on file    Family History  Problem Relation Age of Onset   Arthritis Mother    Cancer Mother 33       ovarian   Anxiety disorder Sister    Depression Sister    Heart disease Maternal Grandmother         Review of Systems  Constitutional:  Negative for fever, malaise/fatigue and weight loss.  HENT:  Negative for congestion and sore throat.   Eyes:        Negative for visual changes  Respiratory:  Negative for cough and shortness of breath.   Cardiovascular:  Negative for chest pain, palpitations and leg swelling.  Gastrointestinal:  Negative for blood in stool, constipation, diarrhea and heartburn.  Genitourinary:  Negative for dysuria, frequency and urgency.  Musculoskeletal:  Negative for falls, joint pain and myalgias.  Skin:  Negative for rash.  Neurological:  Negative for dizziness, sensory change and headaches.  Endo/Heme/Allergies:  Does not bruise/bleed easily.  Psychiatric/Behavioral:  Negative for depression, hallucinations, substance abuse and suicidal ideas. The patient is not nervous/anxious and does not have insomnia.    Objective:   Vitals:   01/03/22 1139  BP: 138/90  Pulse: 90  Resp: 18  Temp: 98.7 F (37.1 C)  SpO2: 98%    Body mass index is 36.58 kg/m.   Physical Examination:  Physical Exam Vitals reviewed. Exam conducted with a chaperone present.  Constitutional:      General: She is not in acute distress.    Appearance: She is well-developed.  HENT:     Right Ear: Ear canal and external ear normal. No middle ear effusion. Tympanic membrane is scarred. Tympanic membrane is not injected, perforated or erythematous.     Left Ear: Ear canal and external ear normal.  No middle ear effusion. Tympanic membrane is scarred. Tympanic membrane is not injected, perforated or erythematous.  Eyes:     Extraocular Movements: Extraocular movements intact.     Conjunctiva/sclera: Conjunctivae normal.  Cardiovascular:      Rate and Rhythm: Normal rate and regular rhythm.     Pulses: Normal pulses.     Heart sounds: Normal heart sounds.  Pulmonary:     Effort: Pulmonary effort is normal. No respiratory distress.     Breath sounds: Normal breath sounds.  Chest:     Chest wall: No tenderness.  Breasts:    Breasts are symmetrical.     Right: Normal.     Left: Normal.  Abdominal:     General: Bowel sounds are normal.     Palpations: Abdomen is soft.     Hernia: There is no hernia in the left inguinal area or right inguinal area.  Genitourinary:  General: Normal vulva.     Exam position: Lithotomy position.     Labia:        Right: No rash, tenderness or lesion.        Left: No rash, tenderness or lesion.      Urethra: No prolapse.     Vagina: Normal.     Cervix: Normal.     Uterus: Normal.      Adnexa: Right adnexa normal and left adnexa normal.  Musculoskeletal:        General: Normal range of motion.     Cervical back: Normal range of motion and neck supple.     Right lower leg: No edema.     Left lower leg: No edema.  Lymphadenopathy:     Upper Body:     Right upper body: No supraclavicular, axillary or pectoral adenopathy.     Left upper body: No supraclavicular, axillary or pectoral adenopathy.     Lower Body: No right inguinal adenopathy. No left inguinal adenopathy.  Skin:    General: Skin is warm and dry.  Neurological:     Mental Status: She is alert and oriented to person, place, and time.     Deep Tendon Reflexes: Reflexes are normal and symmetric.  Psychiatric:        Mood and Affect: Mood normal.        Behavior: Behavior normal.        Thought Content: Thought content normal.   ASSESSMENT and PLAN: This visit occurred during the SARS-CoV-2 public health emergency.  Safety protocols were in place, including screening questions prior to the visit, additional usage of staff PPE, and extensive cleaning of exam room while observing appropriate contact time as indicated for  disinfecting solutions.   Kashari was seen today for annual exam.  Diagnoses and all orders for this visit:  Encounter for preventative adult health care exam with abnormal findings -     Comprehensive metabolic panel -     CBC with Differential/Platelet -     Lipid panel -     TSH -     MM 3D SCREEN BREAST BILATERAL; Future -     Cytology - PAP -     Ambulatory referral to Gastroenterology  Primary hypertension -     amLODipine (NORVASC) 5 MG tablet; Take 1 tablet (5 mg total) by mouth at bedtime.  Encounter for lipid screening for cardiovascular disease -     Lipid panel  Encounter for Papanicolaou smear for cervical cancer screening -     Cytology - PAP  Breast cancer screening by mammogram -     MM 3D SCREEN BREAST BILATERAL; Future  Colon cancer screening -     Ambulatory referral to Gastroenterology  Recurrent major depressive disorder, in partial remission ([facility]) -     venlafaxine XR (EFFEXOR-XR) 150 MG 24 hr capsule; Take 1 capsule (150 mg total) by mouth daily with breakfast.  Family history of ovarian cancer      Problem List Items Addressed This Visit       Cardiovascular and Mediastinum   Primary hypertension    Improved with amlodipine, exercise and low sodium diet BP Readings from Last 3 Encounters:  01/03/22 138/90  11/14/21 (!) 144/94  10/25/21 131/87   Maintain med dose F/up in 44months      Relevant Medications   amLODipine (NORVASC) 5 MG tablet     Other   Depression    Stable with effexor Refill sent  Relevant Medications   venlafaxine XR (EFFEXOR-XR) 150 MG 24 hr capsule   Family history of ovarian cancer    Denies any pelvic pain Mild dysmenorrhea on first 2days of menstrual cycle No hx of dyspareunia.      Other Visit Diagnoses     Encounter for preventative adult health care exam with abnormal findings    -  Primary   Relevant Orders   Comprehensive metabolic panel   CBC with Differential/Platelet   Lipid panel   TSH    MM 3D SCREEN BREAST BILATERAL   Cytology - PAP   Ambulatory referral to Gastroenterology   Encounter for lipid screening for cardiovascular disease       Relevant Orders   Lipid panel   Encounter for Papanicolaou smear for cervical cancer screening       Relevant Orders   Cytology - PAP   Breast cancer screening by mammogram       Relevant Orders   MM 3D SCREEN BREAST BILATERAL   Colon cancer screening       Relevant Orders   Ambulatory referral to Gastroenterology       Follow up: Return in about 3 months (around 04/03/2022) for HTN.  Wilfred Lacy, NP

## 2022-01-03 NOTE — Assessment & Plan Note (Signed)
Improved with amlodipine, exercise and low sodium diet BP Readings from Last 3 Encounters:  01/03/22 138/90  11/14/21 (!) 144/94  10/25/21 131/87   Maintain med dose F/up in 41months

## 2022-01-04 ENCOUNTER — Telehealth: Payer: Self-pay | Admitting: Nurse Practitioner

## 2022-01-04 ENCOUNTER — Encounter: Payer: 59 | Admitting: Nurse Practitioner

## 2022-01-04 NOTE — Telephone Encounter (Signed)
Because of her Fhx of Ovarian Cancer, it is recommended we completed a pelvic US and blodd test for cancer marker-CA 125. Is she ok with these orders?

## 2022-01-04 NOTE — Telephone Encounter (Signed)
LVM for patient to return call. 

## 2022-01-08 ENCOUNTER — Encounter: Payer: Self-pay | Admitting: Family Medicine

## 2022-01-08 ENCOUNTER — Ambulatory Visit (INDEPENDENT_AMBULATORY_CARE_PROVIDER_SITE_OTHER): Payer: 59 | Admitting: Family Medicine

## 2022-01-08 ENCOUNTER — Other Ambulatory Visit: Payer: Self-pay

## 2022-01-08 VITALS — BP 122/78 | HR 100 | Temp 97.2°F | Ht 62.0 in | Wt 197.8 lb

## 2022-01-08 DIAGNOSIS — B349 Viral infection, unspecified: Secondary | ICD-10-CM | POA: Diagnosis not present

## 2022-01-08 DIAGNOSIS — J029 Acute pharyngitis, unspecified: Secondary | ICD-10-CM | POA: Diagnosis not present

## 2022-01-08 LAB — CYTOLOGY - PAP
Comment: NEGATIVE
Diagnosis: UNDETERMINED — AB
High risk HPV: NEGATIVE

## 2022-01-08 LAB — POCT RAPID STREP A (OFFICE): Rapid Strep A Screen: NEGATIVE

## 2022-01-08 NOTE — Progress Notes (Signed)
Established Patient Office Visit  Subjective:  Patient ID: Tiffany Zimmerman, female    DOB: [birth year]  Age: 46 y.o. MRN: [id number]  CC:  Chief Complaint  Patient presents with   Sore Throat    Sore throat and congestion x 3 days. Becoming worse.     HPI Tiffany Zimmerman presents for evaluation and treatment of a 3-day history of URI symptoms with headache, nasal congestion, ear congestion, sore scratchy throat, cough, and poor appetite.  Denies fevers chills, myalgias arthralgias, nausea vomiting, difficulty breathing, wheezing or history of asthma.  She does not smoke.  Home COVID test was negative  Past Medical History:  Diagnosis Date   Allergy    Anxiety    Cancer ([place]) 05/2013   Melanoma In Situ   Depression    GERD (gastroesophageal reflux disease)    Hypertension     Past Surgical History:  Procedure Laterality Date   DILATION AND CURETTAGE OF UTERUS     TONSILLECTOMY      Family History  Problem Relation Age of Onset   Arthritis Mother    Cancer Mother 43       ovarian   Anxiety disorder Sister    Depression Sister    Heart disease Maternal Grandmother     Social History   Socioeconomic History   Marital status: Divorced    Spouse name: Not on file   Number of children: Not on file   Years of education: Not on file   Highest education level: Not on file  Occupational History   Not on file  Tobacco Use   Smoking status: Never   Smokeless tobacco: Never  Vaping Use   Vaping Use: Never used  Substance and Sexual Activity   Alcohol use: Not Currently    Comment: occassional   Drug use: Never   Sexual activity: Not Currently    Birth control/protection: Abstinence  Other Topics Concern   Not on file  Social History Narrative   Not on file   Social Determinants of Health   Financial Resource Strain: Not on file  Food Insecurity: Not on file  Transportation Needs: Not on file  Physical Activity: Not on file  Stress: Not on file  Social Connections:  Not on file  Intimate Partner Violence: Not on file    Outpatient Medications Prior to Visit  Medication Sig Dispense Refill   amLODipine (NORVASC) 5 MG tablet Take 1 tablet (5 mg total) by mouth at bedtime. 90 tablet 3   venlafaxine XR (EFFEXOR-XR) 150 MG 24 hr capsule Take 1 capsule (150 mg total) by mouth daily with breakfast. 90 capsule 3   No facility-administered medications prior to visit.    No Known Allergies  ROS Review of Systems  Constitutional:  Negative for diaphoresis, fever and unexpected weight change.  HENT:  Positive for congestion, hearing loss, postnasal drip and sore throat.   Eyes:  Negative for photophobia and visual disturbance.  Respiratory:  Positive for cough. Negative for shortness of breath and wheezing.   Cardiovascular: Negative.   Gastrointestinal:  Negative for abdominal pain, nausea and vomiting.  Genitourinary: Negative.   Musculoskeletal:  Negative for arthralgias and myalgias.  Skin:  Positive for rash.  Neurological:  Positive for headaches.  Psychiatric/Behavioral: Negative.       Objective:    Physical Exam Constitutional:      General: She is not in acute distress.    Appearance: She is well-developed. She is not ill-appearing, toxic-appearing or diaphoretic.  HENT:     Head: Normocephalic and atraumatic.     Right Ear: Tympanic membrane and ear canal normal. No drainage. No middle ear effusion. Tympanic membrane is not erythematous.     Left Ear: Tympanic membrane and ear canal normal.  No middle ear effusion. Tympanic membrane is not erythematous.     Mouth/Throat:     Mouth: Mucous membranes are moist.     Pharynx: No oropharyngeal exudate or posterior oropharyngeal erythema.     Tonsils: No tonsillar exudate or tonsillar abscesses.  Eyes:     Conjunctiva/sclera: Conjunctivae normal.     Pupils: Pupils are equal, round, and reactive to light.  Neck:     Thyroid: No thyromegaly.  Cardiovascular:     Rate and Rhythm: Normal  rate and regular rhythm.  Pulmonary:     Effort: Pulmonary effort is normal. No respiratory distress.     Breath sounds: Normal breath sounds. No wheezing or rales.  Abdominal:     General: Bowel sounds are normal.  Musculoskeletal:     Cervical back: Normal range of motion and neck supple.  Lymphadenopathy:     Cervical: No cervical adenopathy.  Neurological:     Mental Status: She is alert and oriented to person, place, and time.  Psychiatric:        Mood and Affect: Mood normal.        Behavior: Behavior normal.    BP 122/78 (BP Location: Right Arm, Patient Position: Sitting, Cuff Size: Large)    Pulse 100    Temp (!) 97.2 F (36.2 C) (Temporal)    Ht 5\' 2"  (1.575 m)    Wt 197 lb 12.8 oz (89.7 kg)    LMP 12/19/2021 (Within Days)    SpO2 98%    BMI 36.18 kg/m  Wt Readings from Last 3 Encounters:  01/08/22 197 lb 12.8 oz (89.7 kg)  01/03/22 200 lb (90.7 kg)  11/14/21 207 lb 12.8 oz (94.3 kg)     Health Maintenance Due  Topic Date Due   PAP SMEAR-Modifier  Never done   COLONOSCOPY (Pts 45-30yrs Insurance coverage will need to be confirmed)  Never done    There are no preventive care reminders to display for this patient.  Lab Results  Component Value Date   TSH 0.71 01/03/2022   Lab Results  Component Value Date   WBC 4.6 01/03/2022   HGB 12.6 01/03/2022   HCT 37.9 01/03/2022   MCV 87.7 01/03/2022   PLT 342.0 01/03/2022   Lab Results  Component Value Date   NA 136 01/03/2022   K 4.0 01/03/2022   CO2 26 01/03/2022   GLUCOSE 92 01/03/2022   BUN 10 01/03/2022   CREATININE 0.72 01/03/2022   BILITOT 0.4 01/03/2022   ALKPHOS 70 01/03/2022   AST 23 01/03/2022   ALT 25 01/03/2022   PROT 7.4 01/03/2022   ALBUMIN 4.1 01/03/2022   CALCIUM 9.2 01/03/2022   ANIONGAP 5 10/25/2021   GFR 100.99 01/03/2022   Lab Results  Component Value Date   CHOL 200 01/03/2022   Lab Results  Component Value Date   HDL 52.90 01/03/2022   Lab Results  Component Value Date    LDLCALC 127 (H) 01/03/2022   Lab Results  Component Value Date   TRIG 105.0 01/03/2022   Lab Results  Component Value Date   CHOLHDL 4 01/03/2022   No results found for: HGBA1C    Assessment & Plan:   Problem List Items Addressed This Visit  Respiratory   Pharyngitis   Relevant Orders   POCT rapid strep A (Completed)     Other   Viral syndrome - Primary   Relevant Orders   COVID-19, Flu A+B and RSV    No orders of the defined types were placed in this encounter.   Follow-up: Return in about 1 week (around 01/15/2022), or if symptoms worsen or fail to improve.   Rapid strep test negative today.  COVID test is pending.  Quarantine for 5 days are out of work through this coming Thursday.  Follow-up next week if not improving. Libby Maw, MD

## 2022-01-11 LAB — COVID-19, FLU A+B AND RSV
Influenza A, NAA: NOT DETECTED
Influenza B, NAA: NOT DETECTED
RSV, NAA: NOT DETECTED
SARS-CoV-2, NAA: NOT DETECTED

## 2022-02-05 ENCOUNTER — Ambulatory Visit
Admission: RE | Admit: 2022-02-05 | Discharge: 2022-02-05 | Disposition: A | Discharge status: Home / Self Care | Payer: 59 | Source: Ambulatory Visit | Attending: Nurse Practitioner | Admitting: Nurse Practitioner

## 2022-02-05 DIAGNOSIS — Z0001 Encounter for general adult medical examination with abnormal findings: Secondary | ICD-10-CM

## 2022-02-05 DIAGNOSIS — Z1231 Encounter for screening mammogram for malignant neoplasm of breast: Secondary | ICD-10-CM

## 2022-02-06 NOTE — Progress Notes (Signed)
Normal, repeat in 1-51yr

## 2022-02-28 ENCOUNTER — Encounter: Payer: Self-pay | Admitting: Internal Medicine

## 2022-04-02 ENCOUNTER — Encounter: Payer: Self-pay | Admitting: Nurse Practitioner

## 2022-04-02 ENCOUNTER — Ambulatory Visit: Payer: 59 | Admitting: Nurse Practitioner

## 2022-04-02 VITALS — BP 120/82 | HR 86 | Temp 98.2°F | Ht 62.0 in | Wt 186.2 lb

## 2022-04-02 DIAGNOSIS — I10 Essential (primary) hypertension: Secondary | ICD-10-CM

## 2022-04-02 DIAGNOSIS — F3341 Major depressive disorder, recurrent, in partial remission: Secondary | ICD-10-CM

## 2022-04-02 DIAGNOSIS — F3342 Major depressive disorder, recurrent, in full remission: Secondary | ICD-10-CM

## 2022-04-02 NOTE — Assessment & Plan Note (Signed)
Improved and stable with effexor ?Maintain med dose ?

## 2022-04-02 NOTE — Patient Instructions (Signed)
Maintain med doses ?Keep up the good work with weight loss through heart healthy diet, portion control and exercise. ?F/up in 76month ?

## 2022-04-02 NOTE — Progress Notes (Signed)
? ?             Established Patient Visit ? ?Patient: Tiffany Zimmerman   DOB: June 11, 1976   46 y.o. Female  MRN: [id number] ?Visit Date: 04/02/2022 ? ?Subjective:  ?  ?Chief Complaint  ?Patient presents with  ? Follow-up  ?  3 month f/u on HTN.  ?Pt checks BP randomly at home and states there has been improvement.  ?Colonoscopy scheduled for the end of May 2023 ?  ? ?HPI ?Primary hypertension ?BP at goal with amlodipine ?BP Readings from Last 3 Encounters:  ?04/02/22 120/82  ?01/08/22 122/78  ?01/03/22 138/90  ? ?Maintain med dose ?F/up in 63month ? ?Depression ?Improved and stable with effexor ?Maintain med dose ?  ?Wt Readings from Last 3 Encounters:  ?04/02/22 186 lb 3.2 oz (84.5 kg)  ?01/08/22 197 lb 12.8 oz (89.7 kg)  ?01/03/22 200 lb (90.7 kg)  ?  ? ?  04/02/2022  ? 10:24 AM 01/08/2022  ?  1:36 PM 11/14/2021  ? 11:31 AM  ?Depression screen PHQ 2/9  ?Decreased Interest 0 0 1  ?Down, Depressed, Hopeless 0 0 2  ?PHQ - 2 Score 0 0 3  ?Altered sleeping 1  2  ?Tired, decreased energy 1  2  ?Change in appetite 0  1  ?Feeling bad or failure about yourself  0  2  ?Trouble concentrating 1  1  ?Moving slowly or fidgety/restless 0  0  ?Suicidal thoughts 0  1  ?PHQ-9 Score 3  12  ?Difficult doing work/chores Not difficult at all  Somewhat difficult  ?  ? ?  04/02/2022  ? 10:24 AM 11/14/2021  ? 11:31 AM  ?GAD 7 : Generalized Anxiety Score  ?Nervous, Anxious, on Edge 0 2  ?Control/stop worrying 0 2  ?Worry too much - different things 0 2  ?Trouble relaxing 0 0  ?Restless 1 0  ?Easily annoyed or irritable 0 0  ?Afraid - awful might happen 0 0  ?Total GAD 7 Score 1 6  ?Anxiety Difficulty Not difficult at all Somewhat difficult  ? ?Reviewed medical, surgical, and social history today ? ?Medications: ?Outpatient Medications Prior to Visit  ?Medication Sig  ? amLODipine (NORVASC) 5 MG tablet Take 1 tablet (5 mg total) by mouth at bedtime.  ? venlafaxine XR (EFFEXOR-XR) 150 MG 24 hr capsule Take 1 capsule (150 mg total) by mouth daily with  breakfast.  ? ?No facility-administered medications prior to visit.  ? ?Reviewed past medical and social history.  ? ?ROS per HPI above ? ? ?   ?Objective:  ?BP 120/82 (BP Location: Left Arm, Patient Position: Sitting, Cuff Size: Large)   Pulse 86   Temp 98.2 ?F (36.8 ?C) (Temporal)   Ht '5\' 2"'$  (1.575 m)   Wt 186 lb 3.2 oz (84.5 kg)   SpO2 98%   BMI 34.06 kg/m?  ? ?  ? ?Physical Exam ?Vitals reviewed.  ?Cardiovascular:  ?   Rate and Rhythm: Normal rate and regular rhythm.  ?   Pulses: Normal pulses.  ?   Heart sounds: Normal heart sounds.  ?Pulmonary:  ?   Effort: Pulmonary effort is normal.  ?   Breath sounds: Normal breath sounds.  ?Musculoskeletal:  ?   Right lower leg: No edema.  ?   Left lower leg: No edema.  ?Neurological:  ?   Mental Status: She is alert and oriented to person, place, and time.  ?Psychiatric:     ?   Mood and Affect:  Mood normal.     ?   Behavior: Behavior normal.     ?   Thought Content: Thought content normal.  ?  ?No results found for any visits on 04/02/22. ?   ?Assessment & Plan:  ?  ?Problem List Items Addressed This Visit   ? ?  ? Cardiovascular and Mediastinum  ? Primary hypertension - Primary  ?  BP at goal with amlodipine ?BP Readings from Last 3 Encounters:  ?04/02/22 120/82  ?01/08/22 122/78  ?01/03/22 138/90  ? ?Maintain med dose ?F/up in 53month ?  ?  ?  ? Other  ? Depression  ?  Improved and stable with effexor ?Maintain med dose ? ?  ?  ? ?Return in about 6 months (around 10/02/2022) for HTN and, hyperlipidemia (fasting). ? ?  ? ?CWilfred Lacy NP ? ? ?

## 2022-04-02 NOTE — Assessment & Plan Note (Signed)
BP at goal with amlodipine ?BP Readings from Last 3 Encounters:  ?04/02/22 120/82  ?01/08/22 122/78  ?01/03/22 138/90  ? ?Maintain med dose ?F/up in 19month ?

## 2022-04-30 ENCOUNTER — Encounter: Payer: 59 | Admitting: Internal Medicine

## 2022-06-03 ENCOUNTER — Encounter: Payer: Self-pay | Admitting: Family Medicine

## 2022-06-03 ENCOUNTER — Telehealth: Payer: 59 | Admitting: Family Medicine

## 2022-06-03 VITALS — Ht 62.0 in | Wt 186.0 lb

## 2022-06-03 DIAGNOSIS — U071 COVID-19: Secondary | ICD-10-CM

## 2022-06-03 DIAGNOSIS — H6983 Other specified disorders of Eustachian tube, bilateral: Secondary | ICD-10-CM

## 2022-06-03 MED ORDER — MOMETASONE FUROATE 50 MCG/ACT NA SUSP: 2.0000 | Freq: Every day | NASAL | 12 refills | Status: DC

## 2022-06-03 MED ORDER — PREDNISONE 10 MG PO TABS: 10.0000 mg | ORAL_TABLET | Freq: Two times a day (BID) | ORAL | 0 refills | Status: DC

## 2022-06-03 NOTE — Progress Notes (Signed)
Established Patient Office Visit  Subjective   Patient ID: Tiffany Zimmerman, female    DOB: [birth year]  Age: 46 y.o. MRN: [id number]  Chief Complaint  Patient presents with   Covid Positive    Positive for covid 2 days ago, no taste or smell, little cough, body aches symptoms x 6 days becoming worse.     HPI evaluation of a 7-day history of URI signs and symptoms with frontal headache, nasal congestion postnasal drip, ear congestion and a mild dry cough.  There is malaise and fatigue.  Status post sore throat.  No recent fever.  No wheezing or difficulty breathing or asthma history.  She does not smoke.  Nasal and ear congestion seem to be bothering her the most.  Mucinex maximum cold and cough formula has not been helpful.  Tested positive for COVID 2 days ago.  She has had both COVID vaccines and a booster.    Review of Systems  Constitutional:  Positive for malaise/fatigue. Negative for chills, diaphoresis and weight loss.  HENT:  Positive for congestion, hearing loss and sinus pain. Negative for ear pain.   Eyes: Negative.  Negative for blurred vision and double vision.  Respiratory:  Positive for cough. Negative for sputum production, shortness of breath and wheezing.   Cardiovascular:  Negative for chest pain.  Gastrointestinal:  Negative for abdominal pain.  Genitourinary: Negative.   Musculoskeletal:  Positive for myalgias. Negative for falls.  Neurological:  Positive for headaches. Negative for speech change, loss of consciousness and weakness.  Psychiatric/Behavioral: Negative.        Objective:     Ht 5\' 2"  (1.575 m)   Wt 186 lb (84.4 kg)   BMI 34.02 kg/m    Physical Exam Constitutional:      General: She is not in acute distress.    Appearance: Normal appearance. She is not ill-appearing, toxic-appearing or diaphoretic.  HENT:     Head: Normocephalic and atraumatic.     Right Ear: External ear normal.     Left Ear: External ear normal.     Mouth/Throat:      Pharynx: No oropharyngeal exudate or posterior oropharyngeal erythema.  Eyes:     General: No scleral icterus.       Right eye: No discharge.        Left eye: No discharge.     Extraocular Movements: Extraocular movements intact.     Conjunctiva/sclera: Conjunctivae normal.  Pulmonary:     Effort: Pulmonary effort is normal. No respiratory distress.  Skin:    General: Skin is warm and dry.  Neurological:     Mental Status: She is alert and oriented to person, place, and time.  Psychiatric:        Mood and Affect: Mood normal.        Behavior: Behavior normal.      No results found for any visits on 06/03/22.    The 10-year ASCVD risk score (Arnett DK, et al., 2019) is: 1%    Assessment & Plan:   Problem List Items Addressed This Visit   None Visit Diagnoses     COVID    -  Primary   Dysfunction of both eustachian tubes       Relevant Medications   predniSONE (DELTASONE) 10 MG tablet   mometasone (NASONEX) 50 MCG/ACT nasal spray       Return in about 1 week (around 06/10/2022), or if symptoms worsen or fail to improve.  Eustachian tube exercises failed  to relieve her ear congestion.  She has been taking Mucinex maximum: Cold and cough formula.  Short course of low-dose prednisone and start Nasonex as indicated.  Follow-up in 1 week if not improving.  Information on eustachian tube dysfunction given.  Back to work on Wednesday.  Mliss Sax, MD  Virtual Visit via Video Note  I connected with Di Kindle on 06/03/22 at 10:40 AM EDT by a video enabled telemedicine application and verified that I am speaking with the correct person using two identifiers.  Location: Patient: home alone in a room.  Provider: work   I discussed the limitations of evaluation and management by telemedicine and the availability of in person appointments. The patient expressed understanding and agreed to proceed.  History of Present Illness:     Observations/Objective:   Assessment and Plan:   Follow Up Instructions:    I discussed the assessment and treatment plan with the patient. The patient was provided an opportunity to ask questions and all were answered. The patient agreed with the plan and demonstrated an understanding of the instructions.   The patient was advised to call back or seek an in-person evaluation if the symptoms worsen or if the condition fails to improve as anticipated.  I provided 20 minutes of non-face-to-face time during this encounter.   Mliss Sax, MD

## 2022-06-05 ENCOUNTER — Encounter: Payer: Self-pay | Admitting: Family Medicine

## 2022-06-05 ENCOUNTER — Telehealth: Payer: Self-pay | Admitting: Nurse Practitioner

## 2022-06-05 DIAGNOSIS — H6983 Other specified disorders of Eustachian tube, bilateral: Secondary | ICD-10-CM

## 2022-06-05 DIAGNOSIS — H6993 Unspecified Eustachian tube disorder, bilateral: Secondary | ICD-10-CM

## 2022-06-05 MED ORDER — PREDNISONE 20 MG PO TABS: 20.0000 mg | ORAL_TABLET | Freq: Two times a day (BID) | ORAL | 0 refills | Status: AC

## 2022-06-05 NOTE — Telephone Encounter (Signed)
Please advise message below patient states that prednisone does not seem to be helping and she would like her note for work to be extended?

## 2022-06-05 NOTE — Telephone Encounter (Signed)
Patient aware that Rx sent in, note for extended day to RTW written and sent to patient.

## 2022-06-05 NOTE — Telephone Encounter (Signed)
Pt was seen by mychart on 06/03/22 by Dr. Ethelene Hal for testing positive for covid. She feels the predniSONE (DELTASONE) 10 MG tablet [[id number]] is not helping and she is wanting something else to help with this issue. [facility] [address] [place], [place]. AT [place]  [address] [place] [address]  Phone:  [phone]  Fax:  [phone]   She is also wanting her RTW letter extended to 06/07/22.  Please advise pt at [phone]

## 2022-07-10 ENCOUNTER — Encounter: Payer: Self-pay | Admitting: Internal Medicine

## 2022-08-13 ENCOUNTER — Ambulatory Visit (AMBULATORY_SURGERY_CENTER): Payer: Self-pay | Admitting: *Deleted

## 2022-08-13 VITALS — Ht 62.0 in | Wt 176.0 lb

## 2022-08-13 DIAGNOSIS — Z1211 Encounter for screening for malignant neoplasm of colon: Secondary | ICD-10-CM

## 2022-08-13 MED ORDER — NA SULFATE-K SULFATE-MG SULF 17.5-3.13-1.6 GM/177ML PO SOLN: 1.0000 | Freq: Once | ORAL | 0 refills | Status: AC

## 2022-08-13 NOTE — Progress Notes (Signed)
  Pt states she had a hard time waking up after anesthesia, denies being told they were difficult to intubate, or hx/fam hx of malignant hyperthermia per pt   No egg or soy allergy  No home oxygen use   No medications for weight loss taken  Pt denies constipation issues  Pt informed that we do not do prior authorizations for prep

## 2022-08-20 ENCOUNTER — Encounter: Payer: Self-pay | Admitting: Internal Medicine

## 2022-09-03 ENCOUNTER — Ambulatory Visit (AMBULATORY_SURGERY_CENTER): Payer: BLUE CROSS/BLUE SHIELD | Admitting: Internal Medicine

## 2022-09-03 ENCOUNTER — Encounter: Payer: Self-pay | Admitting: Internal Medicine

## 2022-09-03 VITALS — BP 120/83 | HR 63 | Temp 97.3°F | Resp 14 | Ht 62.0 in | Wt 176.0 lb

## 2022-09-03 DIAGNOSIS — Z1211 Encounter for screening for malignant neoplasm of colon: Secondary | ICD-10-CM | POA: Diagnosis present

## 2022-09-03 MED ORDER — SODIUM CHLORIDE 0.9 % IV SOLN: 500.0000 mL | Freq: Once | INTRAVENOUS | Status: DC

## 2022-09-03 NOTE — Progress Notes (Signed)
Report to PACU, RN, vss, BBS= Clear.  

## 2022-09-03 NOTE — Progress Notes (Signed)
GASTROENTEROLOGY PROCEDURE H&P NOTE   Primary Care Physician: Flossie Buffy, NP    Reason for Procedure:  Colon cancer screening  Plan:    Colonoscopy  Patient is appropriate for endoscopic procedure(s) in the ambulatory ([place]) setting.  The nature of the procedure, as well as the risks, benefits, and alternatives were carefully and thoroughly reviewed with the patient. Ample time for discussion and questions allowed. The patient understood, was satisfied, and agreed to proceed.     HPI: Tiffany Zimmerman is a 46 y.o. female who presents for screening colonoscopy.  Medical history as below.  Tolerated the prep.  No recent chest pain or shortness of breath.  No abdominal pain today.  Past Medical History:  Diagnosis Date   Allergy    Anxiety    Cancer ([place]) 05/2013   Melanoma In Situ   Depression    GERD (gastroesophageal reflux disease)    Hypertension     Past Surgical History:  Procedure Laterality Date   DILATION AND CURETTAGE OF UTERUS     TONSILLECTOMY      Prior to Admission medications   Medication Sig Start Date End Date Taking? Authorizing Provider  amLODipine (NORVASC) 5 MG tablet Take 1 tablet (5 mg total) by mouth at bedtime. 01/03/22  Yes Nche, Charlene Brooke, NP  venlafaxine XR (EFFEXOR-XR) 150 MG 24 hr capsule Take 1 capsule (150 mg total) by mouth daily with breakfast. 01/03/22  Yes Nche, Charlene Brooke, NP  mometasone (NASONEX) 50 MCG/ACT nasal spray Place 2 sprays into the nose daily. Patient not taking: Reported on 09/03/2022 06/03/22   Libby Maw, MD    Current Outpatient Medications  Medication Sig Dispense Refill   amLODipine (NORVASC) 5 MG tablet Take 1 tablet (5 mg total) by mouth at bedtime. 90 tablet 3   venlafaxine XR (EFFEXOR-XR) 150 MG 24 hr capsule Take 1 capsule (150 mg total) by mouth daily with breakfast. 90 capsule 3   mometasone (NASONEX) 50 MCG/ACT nasal spray Place 2 sprays into the nose daily. (Patient not taking:  Reported on 09/03/2022) 1 each 12   Current Facility-Administered Medications  Medication Dose Route Frequency Provider Last Rate Last Admin   0.9 %  sodium chloride infusion  500 mL Intravenous Once Nadia Torr, Lajuan Lines, MD        Allergies as of 09/03/2022   (No Known Allergies)    Family History  Problem Relation Age of Onset   Arthritis Mother    Cancer Mother 52       ovarian   Anxiety disorder Sister    Depression Sister    Heart disease Maternal Grandmother    Colon cancer Neg Hx    Esophageal cancer Neg Hx    Stomach cancer Neg Hx    Rectal cancer Neg Hx     Social History   Socioeconomic History   Marital status: Divorced    Spouse name: Not on file   Number of children: Not on file   Years of education: Not on file   Highest education level: Not on file  Occupational History   Not on file  Tobacco Use   Smoking status: Never   Smokeless tobacco: Never  Vaping Use   Vaping Use: Never used  Substance and Sexual Activity   Alcohol use: Yes    Comment: occassional   Drug use: Never   Sexual activity: Not Currently    Birth control/protection: Abstinence  Other Topics Concern   Not on file  Social History Narrative   Not on file   Social Determinants of Health   Financial Resource Strain: Not on file  Food Insecurity: Not on file  Transportation Needs: Not on file  Physical Activity: Not on file  Stress: Not on file  Social Connections: Not on file  Intimate Partner Violence: Not on file    Physical Exam: Vital signs in last 24 hours: '@BP'$  123/74   Pulse 75   Temp (!) 97.3 F (36.3 C)   Ht '5\' 2"'$  (1.575 m)   Wt 176 lb (79.8 kg)   LMP 08/25/2022 Comment: No chance of pregnancy per pt  SpO2 99%   BMI 32.19 kg/m  GEN: NAD EYE: Sclerae anicteric ENT: MMM CV: Non-tachycardic Pulm: CTA b/l GI: Soft, NT/ND NEURO:  Alert & Oriented x 3   Zenovia Jarred, MD [facility]  09/03/2022 9:06 AM

## 2022-09-03 NOTE — Op Note (Signed)
[facility] Patient Name: Tiffany Zimmerman Procedure Date: 09/03/2022 9:07 AM MRN: [id number] Endoscopist: Jerene Bears , MD Age: 46 Referring MD:  Date of Birth: [birth year] Gender: Female Account #: [id number] Procedure:                Colonoscopy Indications:              Screening for colorectal malignant neoplasm, This                            is the patient's first colonoscopy Medicines:                Monitored Anesthesia Care Procedure:                Pre-Anesthesia Assessment:                           - Prior to the procedure, a History and Physical                            was performed, and patient medications and                            allergies were reviewed. The patient's tolerance of                            previous anesthesia was also reviewed. The risks                            and benefits of the procedure and the sedation                            options and risks were discussed with the patient.                            All questions were answered, and informed consent                            was obtained. Prior Anticoagulants: The patient has                            taken no previous anticoagulant or antiplatelet                            agents. ASA Grade Assessment: II - A patient with                            mild systemic disease. After reviewing the risks                            and benefits, the patient was deemed in                            satisfactory condition to undergo the procedure.  After obtaining informed consent, the colonoscope                            was passed under direct vision. Throughout the                            procedure, the patient's blood pressure, pulse, and                            oxygen saturations were monitored continuously. The                            PCF-HQ190L Colonoscope was introduced through the                            anus and advanced to the  cecum, identified by                            appendiceal orifice and ileocecal valve. The                            colonoscopy was performed without difficulty. The                            patient tolerated the procedure well. The quality                            of the bowel preparation was good. The ileocecal                            valve, appendiceal orifice, and rectum were                            photographed. Scope In: 9:13:18 AM Scope Out: 9:30:30 AM Scope Withdrawal Time: 0 hours 10 minutes 53 seconds  Total Procedure Duration: 0 hours 17 minutes 12 seconds  Findings:                 The digital rectal exam was normal.                           A few medium-mouthed diverticula were found in the                            hepatic flexure and ascending colon.                           Internal hemorrhoids were found during                            retroflexion. The hemorrhoids were small.                           The exam was otherwise without abnormality. Complications:            No immediate complications. Estimated  Blood Loss:     Estimated blood loss: none. Impression:               - Mild diverticulosis at the hepatic flexure and in                            the ascending colon.                           - Internal hemorrhoids.                           - The examination was otherwise normal.                           - No specimens collected. Recommendation:           - Patient has a contact number available for                            emergencies. The signs and symptoms of potential                            delayed complications were discussed with the                            patient. Return to normal activities tomorrow.                            Written discharge instructions were provided to the                            patient.                           - Resume previous diet.                           - Continue present medications.                            - Repeat colonoscopy in 10 years for screening                            purposes. Jerene Bears, MD 09/03/2022 9:33:11 AM This report has been signed electronically.

## 2022-09-03 NOTE — Progress Notes (Signed)
Pt's states no medical or surgical changes since previsit or office visit. 

## 2022-09-03 NOTE — Patient Instructions (Signed)
Handout on diverticulosis given.    YOU HAD AN ENDOSCOPIC PROCEDURE TODAY AT [facility]:   Refer to the procedure report that was given to you for any specific questions about what was found during the examination.  If the procedure report does not answer your questions, please call your gastroenterologist to clarify.  If you requested that your care partner not be given the details of your procedure findings, then the procedure report has been included in a sealed envelope for you to review at your convenience later.  YOU SHOULD EXPECT: Some feelings of bloating in the abdomen. Passage of more gas than usual.  Walking can help get rid of the air that was put into your GI tract during the procedure and reduce the bloating. If you had a lower endoscopy (such as a colonoscopy or flexible sigmoidoscopy) you may notice spotting of blood in your stool or on the toilet paper. If you underwent a bowel prep for your procedure, you may not have a normal bowel movement for a few days.  Please Note:  You might notice some irritation and congestion in your nose or some drainage.  This is from the oxygen used during your procedure.  There is no need for concern and it should clear up in a day or so.  SYMPTOMS TO REPORT IMMEDIATELY:  Following lower endoscopy (colonoscopy or flexible sigmoidoscopy):  Excessive amounts of blood in the stool  Significant tenderness or worsening of abdominal pains  Swelling of the abdomen that is new, acute  Fever of 100F or higher   For urgent or emergent issues, a gastroenterologist can be reached at any hour by calling [phone]. Do not use MyChart messaging for urgent concerns.    DIET:  We do recommend a small meal at first, but then you may proceed to your regular diet.  Drink plenty of fluids but you should avoid alcoholic beverages for 24 hours.  ACTIVITY:  You should plan to take it easy for the rest of today and you should NOT DRIVE or use  heavy machinery until tomorrow (because of the sedation medicines used during the test).    FOLLOW UP: Our staff will call the number listed on your records the next business day following your procedure.  We will call around 7:15- 8:00 am to check on you and address any questions or concerns that you may have regarding the information given to you following your procedure. If we do not reach you, we will leave a message.     If any biopsies were taken you will be contacted by phone or by letter within the next 1-3 weeks.  Please call us at [phone] if you have not heard about the biopsies in 3 weeks.    SIGNATURES/CONFIDENTIALITY: You and/or your care partner have signed paperwork which will be entered into your electronic medical record.  These signatures attest to the fact that that the information above on your After Visit Summary has been reviewed and is understood.  Full responsibility of the confidentiality of this discharge information lies with you and/or your care-partner.

## 2022-09-04 ENCOUNTER — Telehealth: Payer: Self-pay

## 2022-09-04 NOTE — Telephone Encounter (Signed)
Left message on answering machine. 

## 2022-10-01 ENCOUNTER — Ambulatory Visit (INDEPENDENT_AMBULATORY_CARE_PROVIDER_SITE_OTHER): Payer: BLUE CROSS/BLUE SHIELD | Admitting: Nurse Practitioner

## 2022-10-01 ENCOUNTER — Encounter: Payer: Self-pay | Admitting: Nurse Practitioner

## 2022-10-01 VITALS — BP 124/80 | HR 86 | Temp 96.4°F | Ht 62.0 in | Wt 179.6 lb

## 2022-10-01 DIAGNOSIS — E669 Obesity, unspecified: Secondary | ICD-10-CM | POA: Insufficient documentation

## 2022-10-01 DIAGNOSIS — Z6832 Body mass index (BMI) 32.0-32.9, adult: Secondary | ICD-10-CM | POA: Diagnosis not present

## 2022-10-01 DIAGNOSIS — I10 Essential (primary) hypertension: Secondary | ICD-10-CM

## 2022-10-01 DIAGNOSIS — Z23 Encounter for immunization: Secondary | ICD-10-CM | POA: Diagnosis not present

## 2022-10-01 DIAGNOSIS — E6609 Other obesity due to excess calories: Secondary | ICD-10-CM | POA: Diagnosis not present

## 2022-10-01 DIAGNOSIS — F3342 Major depressive disorder, recurrent, in full remission: Secondary | ICD-10-CM | POA: Diagnosis not present

## 2022-10-01 HISTORY — DX: Obesity, unspecified: E66.9

## 2022-10-01 MED ORDER — VENLAFAXINE HCL ER 150 MG PO CP24: 150.0000 mg | ORAL_CAPSULE | Freq: Every day | ORAL | 3 refills | Status: DC

## 2022-10-01 MED ORDER — WEGOVY 0.25 MG/0.5ML ~~LOC~~ SOAJ: SUBCUTANEOUS | 1 refills | Status: DC

## 2022-10-01 MED ORDER — AMLODIPINE BESYLATE 5 MG PO TABS: 5.0000 mg | ORAL_TABLET | Freq: Every day | ORAL | 3 refills | Status: DC

## 2022-10-01 NOTE — Progress Notes (Signed)
Established Patient Visit  Patient: Tiffany Zimmerman   DOB: 1976-05-29   46 y.o. Female  MRN: [id number] Visit Date: 10/01/2022  Subjective:    Chief Complaint  Patient presents with   URI    HTN/ hyperlipidemia Doesn't check BP  Flu shot given today  No concerns    HPI Primary hypertension BP at goal with amlodipine BP Readings from Last 3 Encounters:  10/01/22 124/80  09/03/22 120/83  04/02/22 120/82   Maintain med dose Repeat BMP  Depression Stable mood with effexor Maintain med dose  Class 1 obesity due to excess calories with serious comorbidity and body mass index (BMI) of 32.0 to 32.9 in adult Continues to struggle with weight loss despite diet modifications: low fat/low carbs/low sugar diet and exercise 3x/week-walking 57mles each day. Previous use of phentermine-unable to tolerate-palpitations Wt Readings from Last 3 Encounters:  10/01/22 179 lb 9.6 oz (81.5 kg)  09/03/22 176 lb (79.8 kg)  08/13/22 176 lb (79.8 kg)   We discussed use of GLP-1 injection vs qsymia, possible side effects and contraindications.She agreed to start wegovy injection. She is aware of medication shortage and possible need for PA. rx sent F/up in 2-381month    10/01/2022    9:00 AM 06/03/2022   10:45 AM 04/02/2022   10:24 AM  Depression screen PHQ 2/9  Decreased Interest 1 0 0  Down, Depressed, Hopeless 0 0 0  PHQ - 2 Score 1 0 0  Altered sleeping 1  1  Tired, decreased energy 1  1  Change in appetite 0  0  Feeling bad or failure about yourself  0  0  Trouble concentrating 0  1  Moving slowly or fidgety/restless 0  0  Suicidal thoughts 0  0  PHQ-9 Score 3  3  Difficult doing work/chores Not difficult at all  Not difficult at all       10/01/2022    9:00 AM 04/02/2022   10:24 AM 11/14/2021   11:31 AM  GAD 7 : Generalized Anxiety Score  Nervous, Anxious, on Edge 0 0 2  Control/stop worrying 0 0 2  Worry too much - different things 0 0 2  Trouble relaxing 0 0 0   Restless 0 1 0  Easily annoyed or irritable 0 0 0  Afraid - awful might happen 0 0 0  Total GAD 7 Score 0 1 6  Anxiety Difficulty Not difficult at all Not difficult at all Somewhat difficult   Reviewed medical, surgical, and social history today  Medications: Outpatient Medications Prior to Visit  Medication Sig   [DISCONTINUED] amLODipine (NORVASC) 5 MG tablet Take 1 tablet (5 mg total) by mouth at bedtime.   [DISCONTINUED] venlafaxine XR (EFFEXOR-XR) 150 MG 24 hr capsule Take 1 capsule (150 mg total) by mouth daily with breakfast.   [DISCONTINUED] mometasone (NASONEX) 50 MCG/ACT nasal spray Place 2 sprays into the nose daily. (Patient not taking: Reported on 09/03/2022)   No facility-administered medications prior to visit.   Reviewed past medical and social history.   ROS per HPI above      Objective:  BP 124/80 (BP Location: Right Arm, Patient Position: Sitting, Cuff Size: Normal)   Pulse 86   Temp (!) 96.4 F (35.8 C) (Temporal)   Ht '5\' 2"'$  (1.575 m)   Wt 179 lb 9.6 oz (81.5 kg)   LMP 08/25/2022 Comment: No chance of pregnancy per pt  SpO2 99%   BMI 32.85 kg/m      Physical Exam Constitutional:      Appearance: She is obese.  Cardiovascular:     Rate and Rhythm: Normal rate and regular rhythm.     Pulses: Normal pulses.     Heart sounds: Normal heart sounds.  Pulmonary:     Effort: Pulmonary effort is normal.     Breath sounds: Normal breath sounds.  Musculoskeletal:     Right lower leg: No edema.     Left lower leg: No edema.  Neurological:     Mental Status: She is alert and oriented to person, place, and time.     No results found for any visits on 10/01/22.    Assessment & Plan:    Problem List Items Addressed This Visit       Cardiovascular and Mediastinum   Primary hypertension - Primary    BP at goal with amlodipine BP Readings from Last 3 Encounters:  10/01/22 124/80  09/03/22 120/83  04/02/22 120/82   Maintain med dose Repeat BMP       Relevant Medications   amLODipine (NORVASC) 5 MG tablet   Other Relevant Orders   Basic metabolic panel     Other   Class 1 obesity due to excess calories with serious comorbidity and body mass index (BMI) of 32.0 to 32.9 in adult    Continues to struggle with weight loss despite diet modifications: low fat/low carbs/low sugar diet and exercise 3x/week-walking 29mles each day. Previous use of phentermine-unable to tolerate-palpitations Wt Readings from Last 3 Encounters:  10/01/22 179 lb 9.6 oz (81.5 kg)  09/03/22 176 lb (79.8 kg)  08/13/22 176 lb (79.8 kg)   We discussed use of GLP-1 injection vs qsymia, possible side effects and contraindications.She agreed to start wegovy injection. She is aware of medication shortage and possible need for PA. rx sent F/up in 2-31month     Relevant Medications   Semaglutide-Weight Management (WEGOVY) 0.25 MG/0.5ML SOAJ   Depression    Stable mood with effexor Maintain med dose      Relevant Medications   venlafaxine XR (EFFEXOR-XR) 150 MG 24 hr capsule   Return in about 3 months (around 01/01/2023) for  Weight management.     ChWilfred LacyNP

## 2022-10-01 NOTE — Patient Instructions (Signed)
Start [employer] other medication doses.  Semaglutide Injection (Weight Management) What is this medication? SEMAGLUTIDE (SEM a GLOO tide) promotes weight loss. It may also be used to maintain weight loss. It works by decreasing appetite. Changes to diet and exercise are often combined with this medication. This medicine may be used for other purposes; ask your health care provider or pharmacist if you have questions. COMMON BRAND NAME(S): JJHERD What should I tell my care team before I take this medication? They need to know if you have any of these conditions: Endocrine tumors (MEN 2) or if someone in your family had these tumors Eye disease, vision problems Gallbladder disease History of depression or mental health disease History of pancreatitis Kidney disease Stomach or intestine problems Suicidal thoughts, plans, or attempt; a previous suicide attempt by you or a family member Thyroid cancer or if someone in your family had thyroid cancer An unusual or allergic reaction to semaglutide, other medications, foods, dyes, or preservatives Pregnant or trying to get pregnant Breast-feeding How should I use this medication? This medication is injected under the skin. You will be taught how to prepare and give it. Take it as directed on the prescription label. It is given once every week (every 7 days). Keep taking it unless your care team tells you to stop. It is important that you put your used needles and pens in a special sharps container. Do not put them in a trash can. If you do not have a sharps container, call your pharmacist or care team to get one. A special MedGuide will be given to you by the pharmacist with each prescription and refill. Be sure to read this information carefully each time. This medication comes with INSTRUCTIONS FOR USE. Ask your pharmacist for directions on how to use this medication. Read the information carefully. Talk to your pharmacist or care team if  you have questions. Talk to your care team about the use of this medication in children. While it may be prescribed for children as young as 12 years for selected conditions, precautions do apply. Overdosage: If you think you have taken too much of this medicine contact a poison control center or emergency room at once. NOTE: This medicine is only for you. Do not share this medicine with others. What if I miss a dose? If you miss a dose and the next scheduled dose is more than 2 days away, take the missed dose as soon as possible. If you miss a dose and the next scheduled dose is less than 2 days away, do not take the missed dose. Take the next dose at your regular time. Do not take double or extra doses. If you miss your dose for 2 weeks or more, take the next dose at your regular time or call your care team to talk about how to restart this medication. What may interact with this medication? Insulin and other medications for diabetes This list may not describe all possible interactions. Give your health care provider a list of all the medicines, herbs, non-prescription drugs, or dietary supplements you use. Also tell them if you smoke, drink alcohol, or use illegal drugs. Some items may interact with your medicine. What should I watch for while using this medication? Visit your care team for regular checks on your progress. It may be some time before you see the benefit from this medication. Drink plenty of fluids while taking this medication. Check with your care team if you have severe diarrhea, nausea,  and vomiting, or if you sweat a lot. The loss of too much body fluid may make it dangerous for you to take this medication. This medication may affect blood sugar levels. Ask your care team if changes in diet or medications are needed if you have diabetes. If you or your family notice any changes in your behavior, such as new or worsening depression, thoughts of harming yourself, anxiety, other  unusual or disturbing thoughts, or memory loss, call your care team right away. Women should inform their care team if they wish to become pregnant or think they might be pregnant. Losing weight while pregnant is not advised and may cause harm to the unborn child. Talk to your care team for more information. What side effects may I notice from receiving this medication? Side effects that you should report to your care team as soon as possible: Allergic reactions--skin rash, itching, hives, swelling of the face, lips, tongue, or throat Change in vision Dehydration--increased thirst, dry mouth, feeling faint or lightheaded, headache, dark yellow or brown urine Gallbladder problems--severe stomach pain, nausea, vomiting, fever Heart palpitations--rapid, pounding, or irregular heartbeat Kidney injury--decrease in the amount of urine, swelling of the ankles, hands, or feet Pancreatitis--severe stomach pain that spreads to your back or gets worse after eating or when touched, fever, nausea, vomiting Thoughts of suicide or self-harm, worsening mood, feelings of depression Thyroid cancer--new mass or lump in the neck, pain or trouble swallowing, trouble breathing, hoarseness Side effects that usually do not require medical attention (report to your care team if they continue or are bothersome): Diarrhea Loss of appetite Nausea Stomach pain Vomiting This list may not describe all possible side effects. Call your doctor for medical advice about side effects. You may report side effects to FDA at 1-800-FDA-1088. Where should I keep my medication? Keep out of the reach of children and pets. Refrigeration (preferred): Store in the refrigerator. Do not freeze. Keep this medication in the original container until you are ready to take it. Get rid of any unused medication after the expiration date. Room temperature: If needed, prior to cap removal, the pen can be stored at room temperature for up to 28 days.  Protect from light. If it is stored at room temperature, get rid of any unused medication after 28 days or after it expires, whichever is first. It is important to get rid of the medication as soon as you no longer need it or it is expired. You can do this in two ways: Take the medication to a medication take-back program. Check with your pharmacy or law enforcement to find a location. If you cannot return the medication, follow the directions in the [facility]. NOTE: This sheet is a summary. It may not cover all possible information. If you have questions about this medicine, talk to your doctor, pharmacist, or health care provider.  2023 Elsevier/Gold Standard (2021-02-08 00:00:00)

## 2022-10-01 NOTE — Assessment & Plan Note (Addendum)
Continues to struggle with weight loss despite diet modifications: low fat/low carbs/low sugar diet and exercise 3x/week-walking 52mles each day. Previous use of phentermine-unable to tolerate-palpitations Wt Readings from Last 3 Encounters:  10/01/22 179 lb 9.6 oz (81.5 kg)  09/03/22 176 lb (79.8 kg)  08/13/22 176 lb (79.8 kg)   We discussed use of GLP-1 injection vs qsymia, possible side effects and contraindications.She agreed to start wegovy injection. She is aware of medication shortage and possible need for PA. rx sent F/up in 2-315month

## 2022-10-01 NOTE — Assessment & Plan Note (Signed)
Stable mood with effexor Maintain med dose

## 2022-10-01 NOTE — Assessment & Plan Note (Signed)
BP at goal with amlodipine BP Readings from Last 3 Encounters:  10/01/22 124/80  09/03/22 120/83  04/02/22 120/82   Maintain med dose Repeat BMP

## 2022-10-11 ENCOUNTER — Other Ambulatory Visit (HOSPITAL_COMMUNITY): Payer: Self-pay

## 2022-10-21 ENCOUNTER — Telehealth: Payer: Self-pay

## 2022-10-21 NOTE — Telephone Encounter (Signed)
Please be advised appeals has been started and is being sent.

## 2022-10-21 NOTE — Telephone Encounter (Signed)
  Received a fax from [employer] regarding Prior Authorization for Wegovy 0.'25MG'$ /0.5ML auto-injectors    Authorization has been DENIED  Key: [id number]

## 2022-11-14 IMAGING — DX DG CHEST 2V
2 series · 2 of 2 positions shown · non-contrast
Comparison: None.

CLINICAL DATA: Dyspnea

EXAM:
CHEST - 2 VIEW

[chest pa]
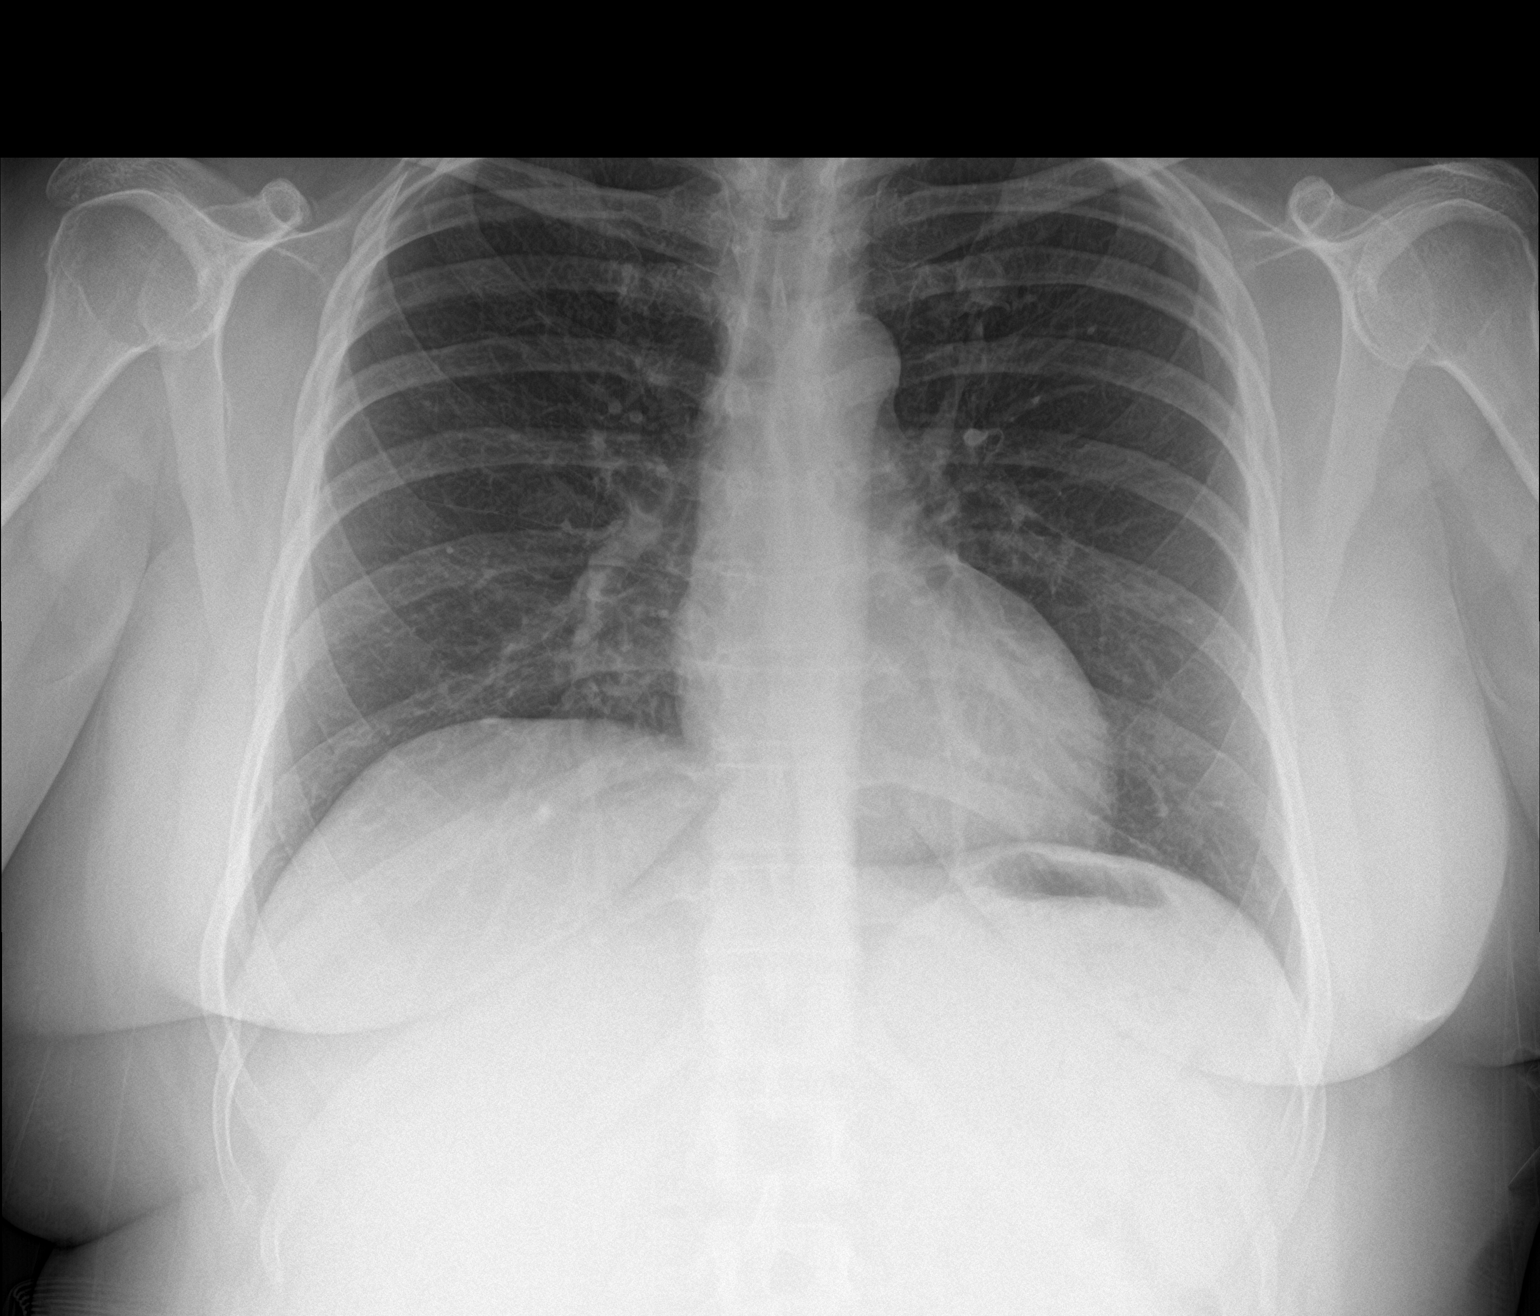

[chest lat]
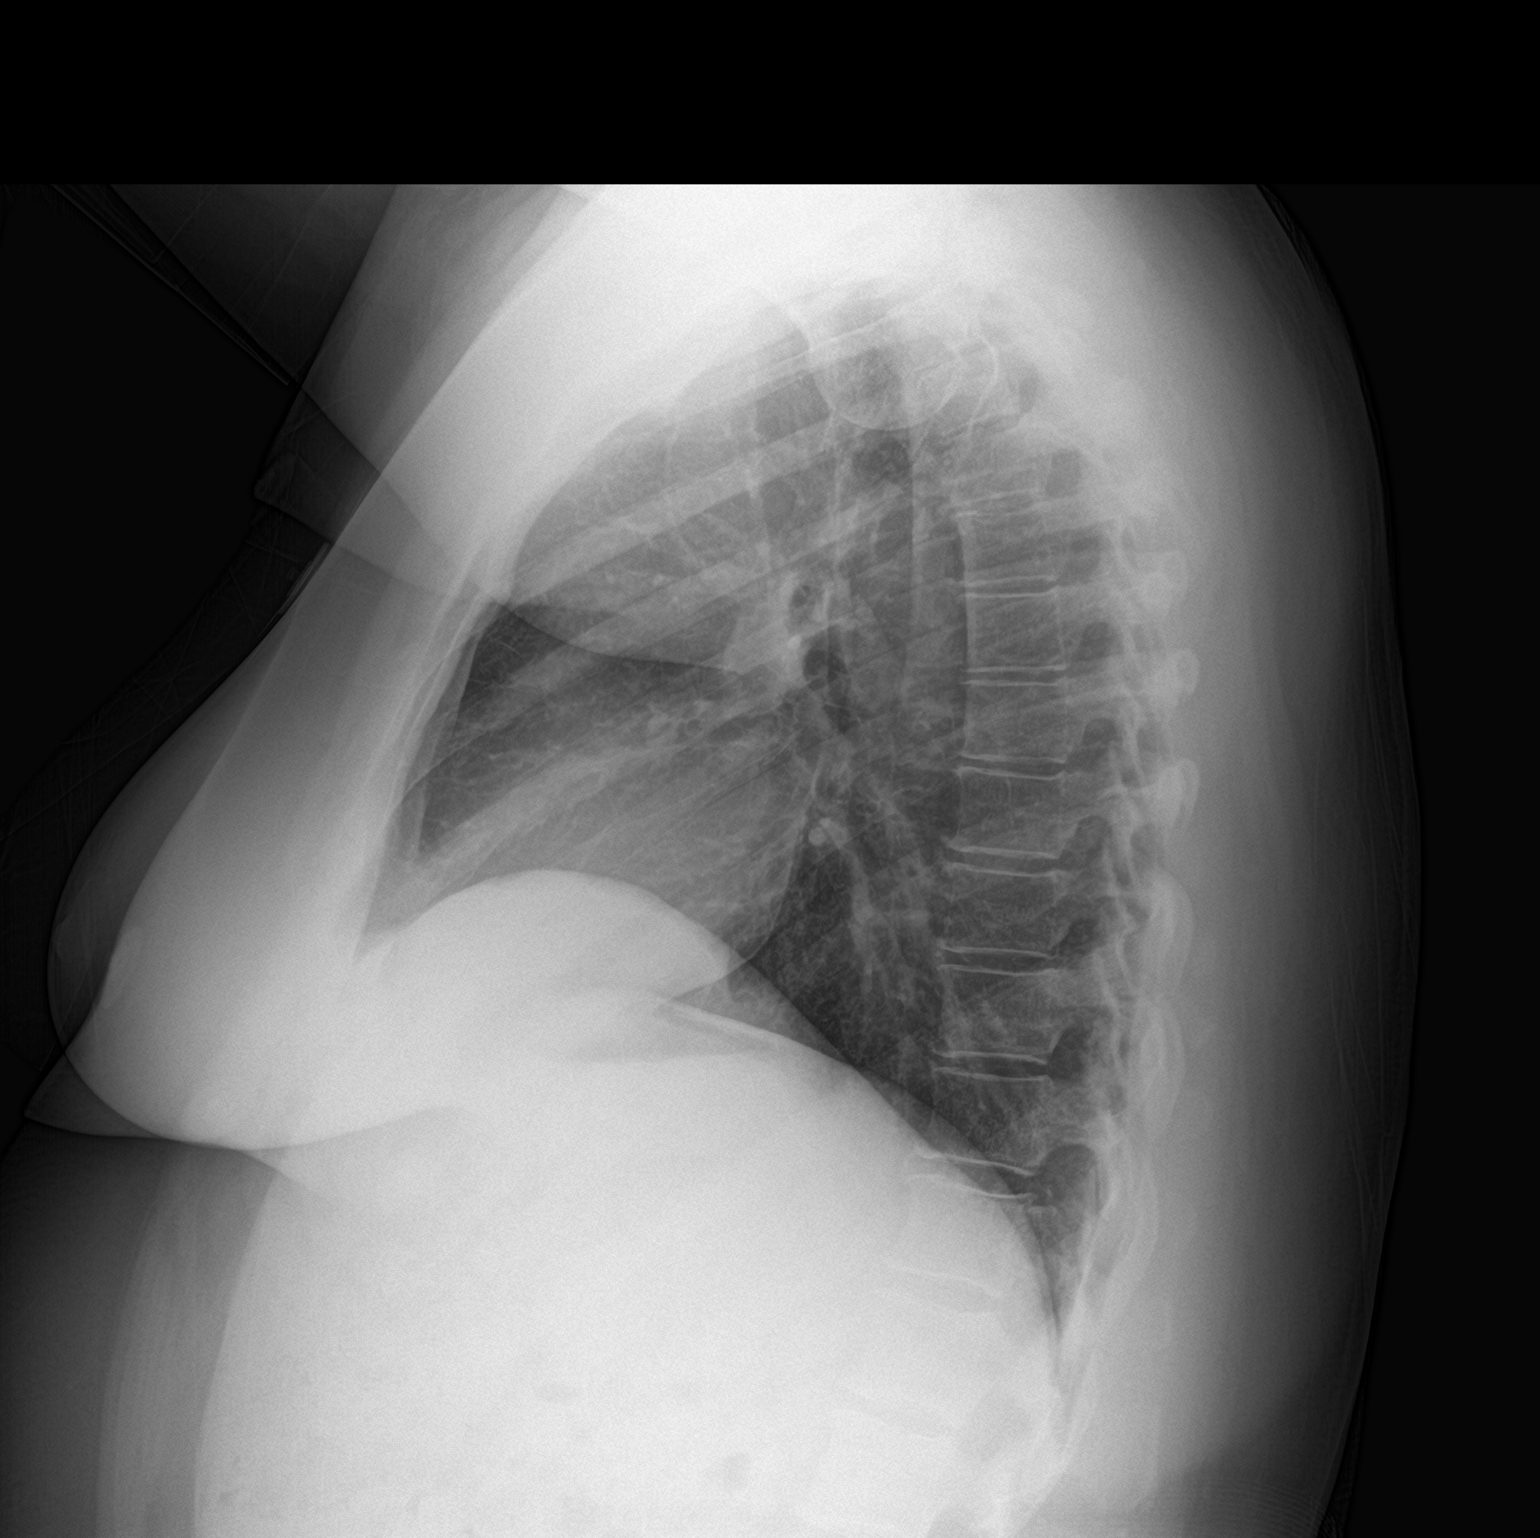

[2 of 2 positions shown; findings below may reference images not displayed]

FINDINGS: The heart size and mediastinal contours are within normal limits.
Both lungs are clear. The visualized skeletal structures are
unremarkable.
IMPRESSION: No acute abnormality of the lungs.

## 2023-02-25 IMAGING — MG MM DIGITAL SCREENING BILAT W/ TOMO AND CAD
8 series · 8 of 24 positions shown · non-contrast
Comparison: None.

CLINICAL DATA: Screening.

EXAM:
DIGITAL SCREENING BILATERAL MAMMOGRAM WITH TOMOSYNTHESIS AND CAD
TECHNIQUE: Bilateral screening digital craniocaudal and mediolateral oblique
mammograms were obtained. Bilateral screening digital breast
tomosynthesis was performed. The images were evaluated with
computer-aided detection.

[L MLO synth-2D]
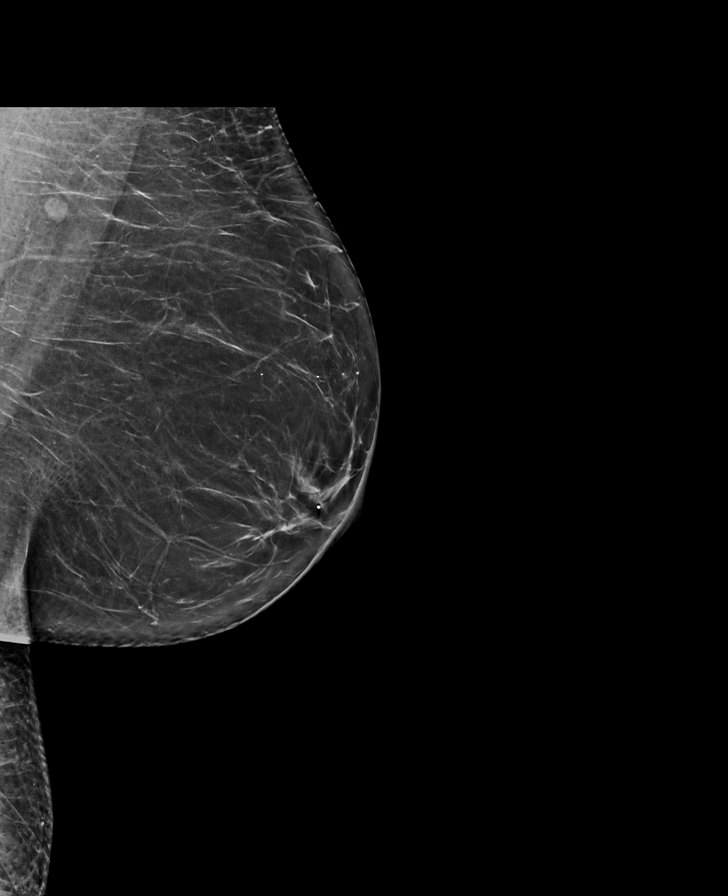

[L CC synth-2D]
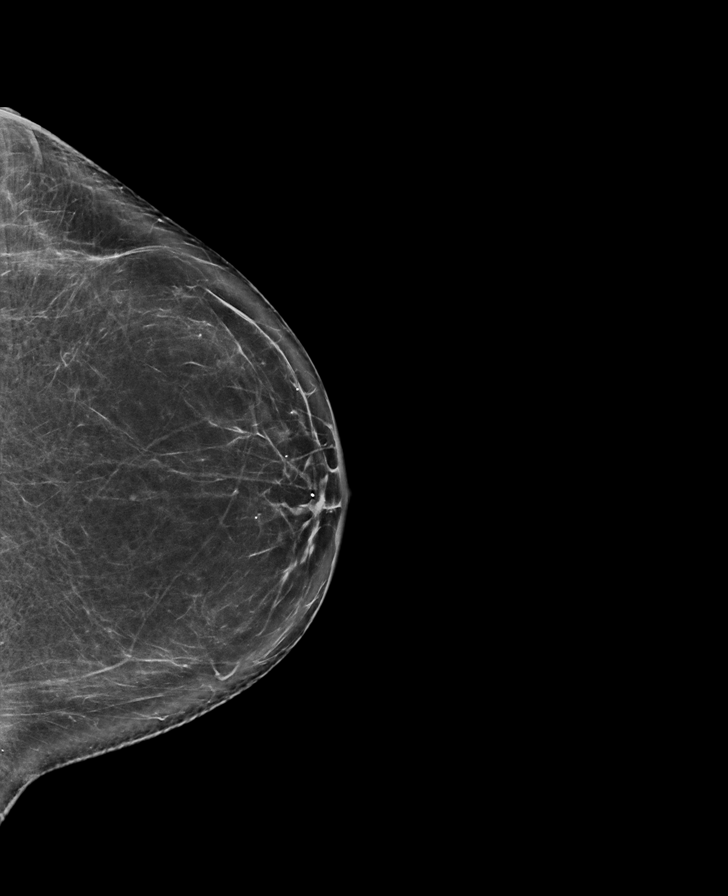

[R MLO synth-2D]
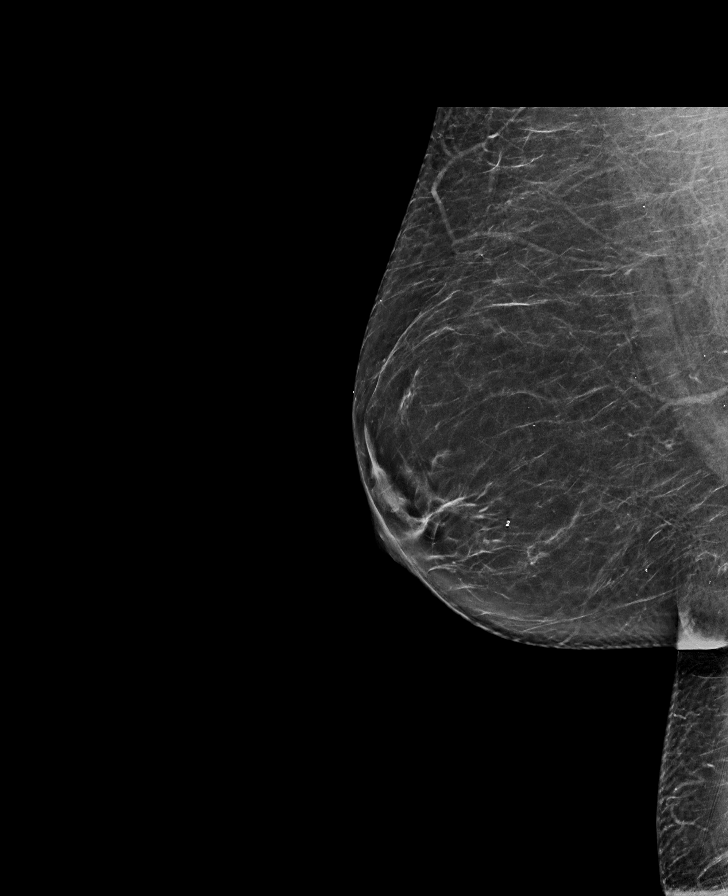

[R CC synth-2D]
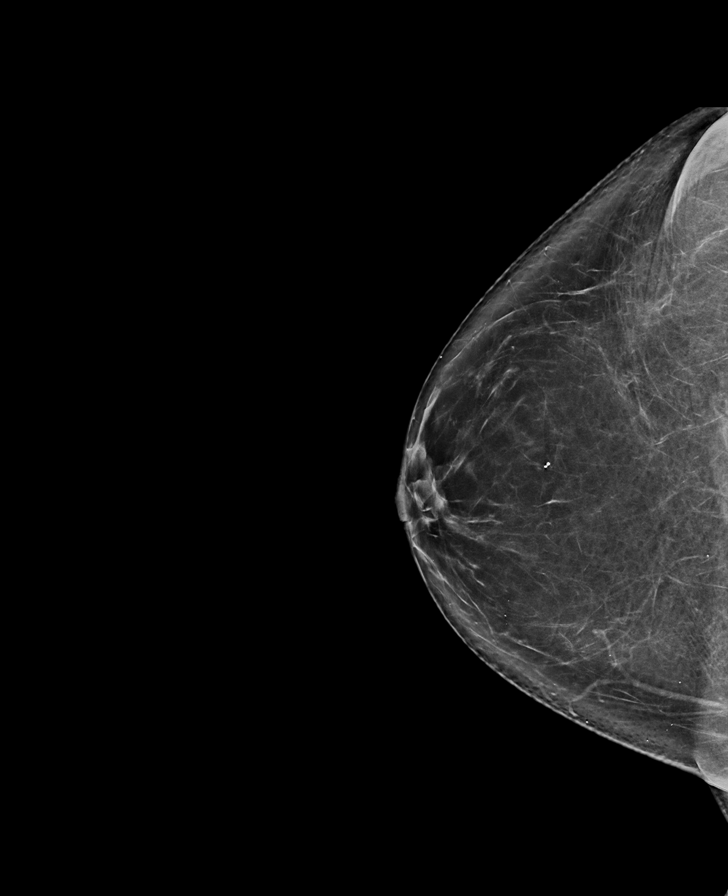

[L MLO tomo · tomo slice 41/80.0]
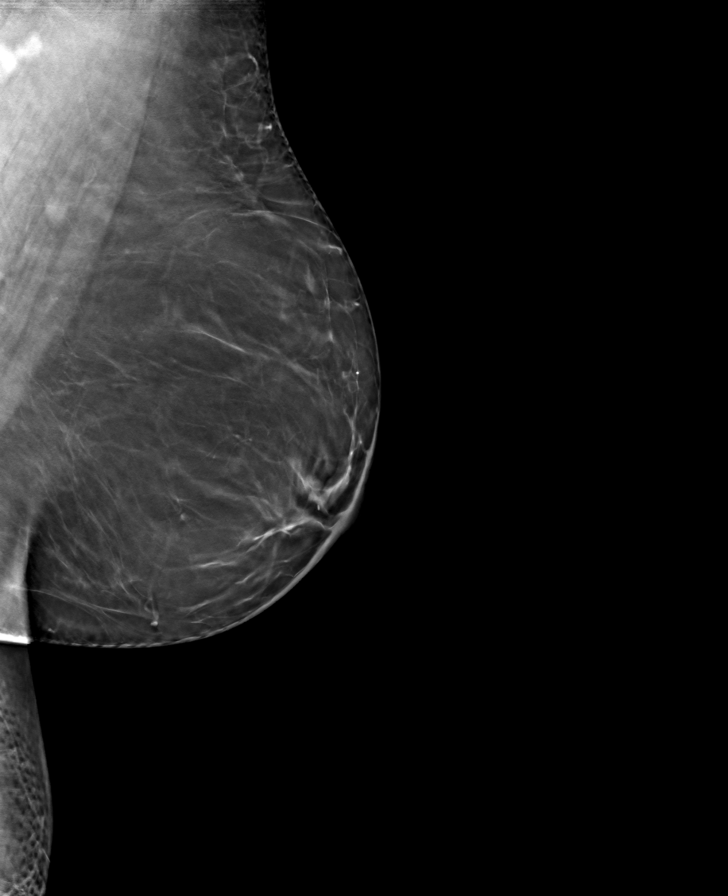

[L CC tomo · tomo slice 39/78.0]
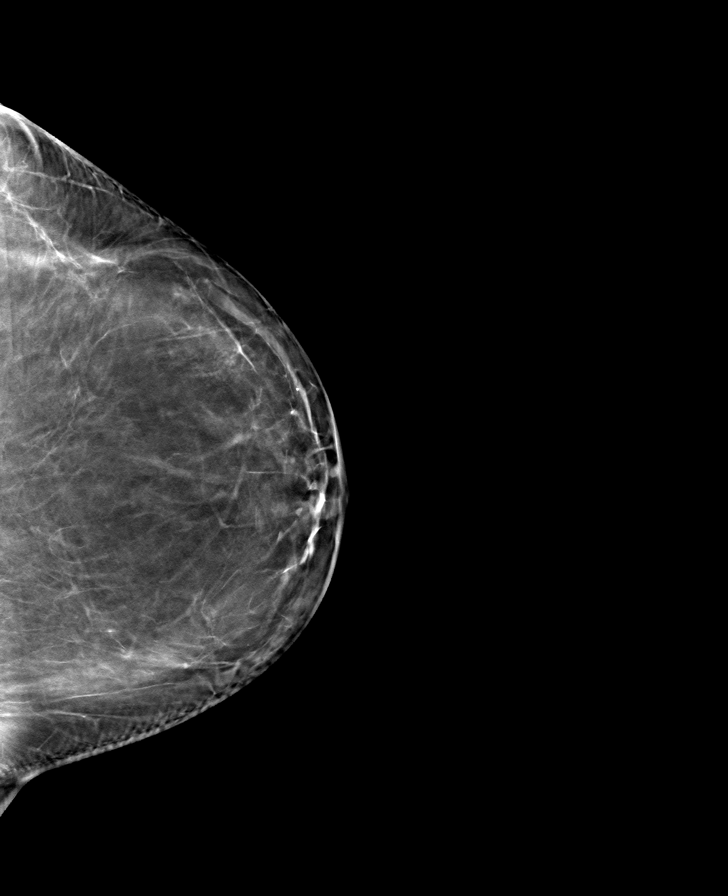

[R CC tomo · tomo slice 41/81.0]
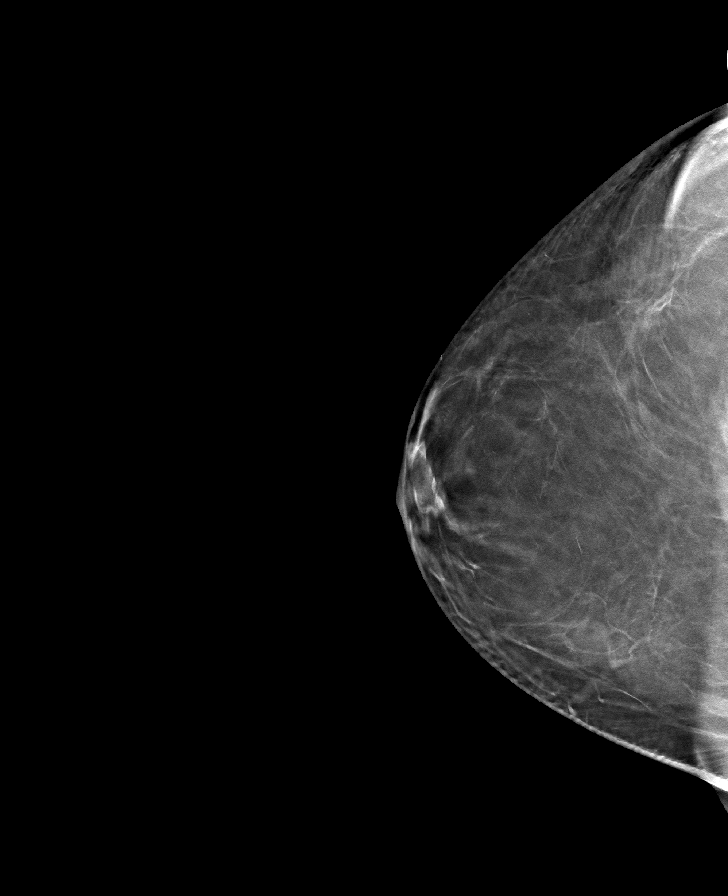

[R MLO tomo · tomo slice 41/81.0]
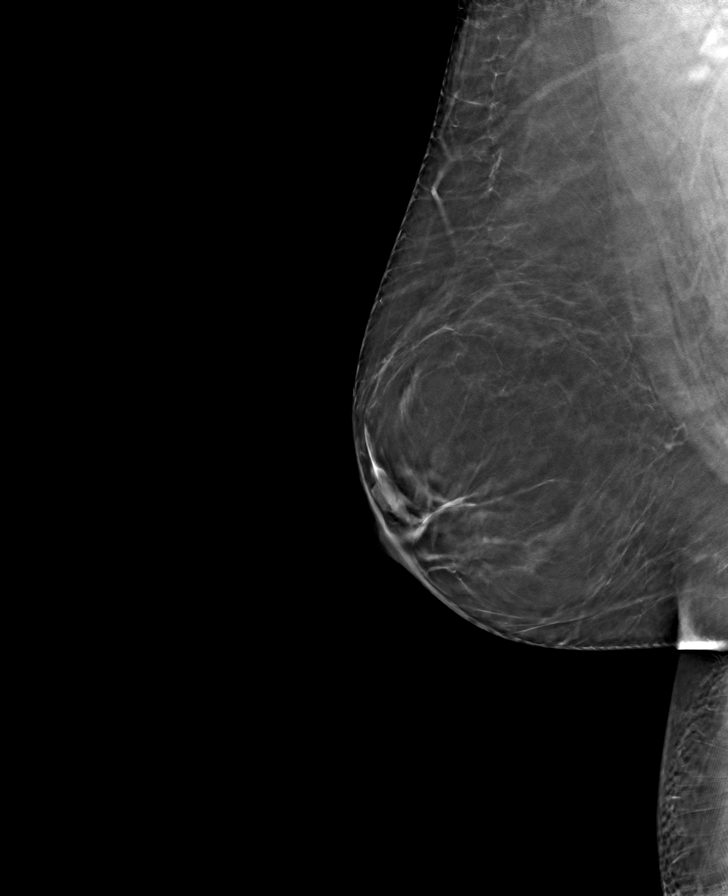

[8 of 24 positions shown; findings below may reference images not displayed]

ACR Breast Density Category b: There are scattered areas of
fibroglandular density.
FINDINGS: There are no findings suspicious for malignancy.
IMPRESSION: No mammographic evidence of malignancy. A result letter of this
screening mammogram will be mailed directly to the patient.

RECOMMENDATION:
Screening mammogram in one year. (Code:XG-X-X7B)

BI-RADS CATEGORY  1: Negative.

## 2023-04-01 ENCOUNTER — Encounter: Payer: BLUE CROSS/BLUE SHIELD | Admitting: Nurse Practitioner

## 2023-04-14 ENCOUNTER — Telehealth: Payer: Self-pay | Admitting: Nurse Practitioner

## 2023-04-14 ENCOUNTER — Ambulatory Visit: Payer: BLUE CROSS/BLUE SHIELD | Admitting: Nurse Practitioner

## 2023-04-14 NOTE — Telephone Encounter (Signed)
Pt was a no show for an OV with [facility] on 04/14/23, I sent a letter.

## 2023-04-15 ENCOUNTER — Encounter: Payer: Self-pay | Admitting: Nurse Practitioner

## 2023-04-15 ENCOUNTER — Ambulatory Visit: Payer: 59 | Admitting: Nurse Practitioner

## 2023-04-15 ENCOUNTER — Other Ambulatory Visit (HOSPITAL_COMMUNITY)
Admission: RE | Admit: 2023-04-15 | Discharge: 2023-04-15 | Disposition: A | Discharge status: Home / Self Care | Payer: 59 | Source: Ambulatory Visit | Attending: Nurse Practitioner | Admitting: Nurse Practitioner

## 2023-04-15 VITALS — BP 118/82 | HR 81 | Temp 97.7°F | Resp 16 | Ht 62.0 in | Wt 182.0 lb

## 2023-04-15 DIAGNOSIS — E6609 Other obesity due to excess calories: Secondary | ICD-10-CM

## 2023-04-15 DIAGNOSIS — R87610 Atypical squamous cells of undetermined significance on cytologic smear of cervix (ASC-US): Secondary | ICD-10-CM | POA: Insufficient documentation

## 2023-04-15 DIAGNOSIS — I10 Essential (primary) hypertension: Secondary | ICD-10-CM | POA: Diagnosis not present

## 2023-04-15 DIAGNOSIS — E7800 Pure hypercholesterolemia, unspecified: Secondary | ICD-10-CM | POA: Diagnosis not present

## 2023-04-15 DIAGNOSIS — F3342 Major depressive disorder, recurrent, in full remission: Secondary | ICD-10-CM

## 2023-04-15 DIAGNOSIS — Z6833 Body mass index (BMI) 33.0-33.9, adult: Secondary | ICD-10-CM

## 2023-04-15 HISTORY — DX: Atypical squamous cells of undetermined significance on cytologic smear of cervix (ASC-US): R87.610

## 2023-04-15 LAB — LIPID PANEL
Cholesterol: 235 mg/dL — ABNORMAL HIGH (ref 0–200)
HDL: 54 mg/dL (ref >39.00)
LDL Cholesterol: 145 mg/dL — ABNORMAL HIGH (ref 0–99)
NonHDL: 181.14
Total CHOL/HDL Ratio: 4
Triglycerides: 183 mg/dL — ABNORMAL HIGH (ref 0.0–149.0)
VLDL: 36.6 mg/dL (ref 0.0–40.0)

## 2023-04-15 LAB — TSH: TSH: 0.82 u[IU]/mL (ref 0.35–5.50)

## 2023-04-15 LAB — COMPREHENSIVE METABOLIC PANEL
ALT: 35 U/L (ref 0–35)
AST: 19 U/L (ref 0–37)
Albumin: 4 g/dL (ref 3.5–5.2)
Alkaline Phosphatase: 71 U/L (ref 39–117)
BUN: 11 mg/dL (ref 6–23)
CO2: 29 mEq/L (ref 19–32)
Calcium: 8.8 mg/dL (ref 8.4–10.5)
Chloride: 101 mEq/L (ref 96–112)
Creatinine, Ser: 0.61 mg/dL (ref 0.40–1.20)
GFR: 107.02 mL/min (ref >60.00)
Glucose, Bld: 88 mg/dL (ref 70–99)
Potassium: 4.3 mEq/L (ref 3.5–5.1)
Sodium: 137 mEq/L (ref 135–145)
Total Bilirubin: 0.2 mg/dL (ref 0.2–1.2)
Total Protein: 7 g/dL (ref 6.0–8.3)

## 2023-04-15 LAB — HEMOGLOBIN A1C: Hgb A1c MFr Bld: 6.1 % (ref 4.6–6.5)

## 2023-04-15 NOTE — Assessment & Plan Note (Signed)
BP at goal with amlodipine BP Readings from Last 3 Encounters:  04/15/23 118/82  10/01/22 124/80  09/03/22 120/83    Maintain med dose

## 2023-04-15 NOTE — Assessment & Plan Note (Signed)
Repeat lipid panel Advised about need for heart healthy diet and daily exercise

## 2023-04-15 NOTE — Assessment & Plan Note (Signed)
Repeat PAP today 

## 2023-04-15 NOTE — Assessment & Plan Note (Signed)
Unable to afford wegovy rx She plans to [facility] care [facility]: Inject Rx in [place]. She has not made any lifestyle modifications Wt Readings from Last 3 Encounters:  04/15/23 182 lb (82.6 kg)  10/01/22 179 lb 9.6 oz (81.5 kg)  09/03/22 176 lb (79.8 kg)    Advised about the importance of lifestylye modifications for longterm weight loss and healthy weight maintenance. Check CMP, TSH, hgbA1c, and lipid panel.

## 2023-04-15 NOTE — Assessment & Plan Note (Signed)
Stable mood with effexor

## 2023-04-15 NOTE — Patient Instructions (Addendum)
Inquire if current insurance covers wegovy or zepbound or contrave and/or nutritionist visits. Schedule appt for annual mammogram Go to lab

## 2023-04-15 NOTE — Progress Notes (Signed)
Established Patient Visit  Patient: Tiffany Zimmerman   DOB: Sep 13, 1976   47 y.o. Female  MRN: [id number] Visit Date: 04/15/2023  Subjective:    Chief Complaint  Patient presents with   Hypertension   Weight Check    Since she is unable to get the [facility], she found a weight loss clinic she can go to and they requested a complete lab panel.  Pt is fasting    Primary hypertension BP at goal with amlodipine BP Readings from Last 3 Encounters:  04/15/23 118/82  10/01/22 124/80  09/03/22 120/83    Maintain med dose  Obesity Unable to afford wegovy rx She plans to start care under [facility] weight loss clinic: Inject Rx in [place]. She has not made any lifestyle modifications Wt Readings from Last 3 Encounters:  04/15/23 182 lb (82.6 kg)  10/01/22 179 lb 9.6 oz (81.5 kg)  09/03/22 176 lb (79.8 kg)    Advised about the importance of lifestylye modifications for longterm weight loss and healthy weight maintenance. Check CMP, TSH, hgbA1c, and lipid panel.  Atypical squamous cells of undetermined significance on cytologic smear of cervix (ASC-US) Repeat PAP today  Elevated LDL cholesterol level Repeat lipid panel Advised about need for heart healthy diet and daily exercise  Depression Stable mood with effexor  BP Readings from Last 3 Encounters:  04/15/23 118/82  10/01/22 124/80  09/03/22 120/83     Reviewed medical, surgical, and social history today  Medications: Outpatient Medications Prior to Visit  Medication Sig   amLODipine (NORVASC) 5 MG tablet Take 1 tablet (5 mg total) by mouth at bedtime.   venlafaxine XR (EFFEXOR-XR) 150 MG 24 hr capsule Take 1 capsule (150 mg total) by mouth daily with breakfast.   [DISCONTINUED] Semaglutide-Weight Management (WEGOVY) 0.25 MG/0.5ML SOAJ 0.25mg  weekly x 2weeks, then 0.5mg  weekly continuosly (Patient not taking: Reported on 04/15/2023)   No facility-administered medications prior to visit.   Reviewed past medical and  social history.   ROS per HPI above      Objective:  BP 118/82 (BP Location: Right Arm, Patient Position: Sitting, Cuff Size: Large)   Pulse 81   Temp 97.7 F (36.5 C) (Temporal)   Resp 16   Ht 5\' 2"  (1.575 m)   Wt 182 lb (82.6 kg)   LMP 04/08/2023 Comment: not sexually active  SpO2 99%   BMI 33.29 kg/m      Physical Exam Vitals and nursing note reviewed. Exam conducted with a chaperone present.  Cardiovascular:     Rate and Rhythm: Normal rate and regular rhythm.     Pulses: Normal pulses.     Heart sounds: Normal heart sounds.  Pulmonary:     Effort: Pulmonary effort is normal.     Breath sounds: Normal breath sounds.  Abdominal:     General: There is no distension.     Tenderness: There is no abdominal tenderness. There is no guarding.  Genitourinary:    General: Normal vulva.     Exam position: Lithotomy position.     Labia:        Right: No rash, tenderness or lesion.        Left: No rash or lesion.      Urethra: No prolapse or urethral pain.     Vagina: No signs of injury and foreign body. Bleeding present. No vaginal discharge, erythema, tenderness, lesions or prolapsed vaginal walls.  Cervix: Cervical bleeding present. No cervical motion tenderness, friability or erythema.     Uterus: Normal.      Adnexa: Right adnexa normal and left adnexa normal.  Lymphadenopathy:     Lower Body: No right inguinal adenopathy. No left inguinal adenopathy.  Neurological:     Mental Status: She is alert and oriented to person, place, and time.     No results found for any visits on 04/15/23.    Assessment & Plan:    Problem List Items Addressed This Visit       Cardiovascular and Mediastinum   Primary hypertension - Primary    BP at goal with amlodipine BP Readings from Last 3 Encounters:  04/15/23 118/82  10/01/22 124/80  09/03/22 120/83    Maintain med dose      Relevant Orders   Comprehensive metabolic panel     Genitourinary   Atypical squamous  cells of undetermined significance on cytologic smear of cervix (ASC-US)    Repeat PAP today      Relevant Orders   Cytology - PAP     Other   Elevated LDL cholesterol level    Repeat lipid panel Advised about need for heart healthy diet and daily exercise      Relevant Orders   Lipid panel   Obesity    Unable to afford wegovy rx She plans to start care under onlie weight loss clinic: Inject Rx in [place]. She has not made any lifestyle modifications Wt Readings from Last 3 Encounters:  04/15/23 182 lb (82.6 kg)  10/01/22 179 lb 9.6 oz (81.5 kg)  09/03/22 176 lb (79.8 kg)    Advised about the importance of lifestylye modifications for longterm weight loss and healthy weight maintenance. Check CMP, TSH, hgbA1c, and lipid panel.      Relevant Orders   Hemoglobin A1c   Lipid panel   Comprehensive metabolic panel   TSH   RESOLVED: Recurrent major depressive disorder, in full remission (HCC)   Return in about 1 year (around 04/14/2024) for CPE (fasting).     Alysia Penna, NP

## 2023-04-21 LAB — CYTOLOGY - PAP
Comment: NEGATIVE
Diagnosis: NEGATIVE
Diagnosis: REACTIVE
High risk HPV: NEGATIVE

## 2023-05-06 NOTE — Telephone Encounter (Signed)
1st no show, fee waived, letter sent, pt seen 5/7

## 2023-12-25 ENCOUNTER — Other Ambulatory Visit: Payer: Self-pay | Admitting: Nurse Practitioner

## 2023-12-25 DIAGNOSIS — F3342 Major depressive disorder, recurrent, in full remission: Secondary | ICD-10-CM

## 2023-12-25 DIAGNOSIS — I10 Essential (primary) hypertension: Secondary | ICD-10-CM

## 2023-12-26 ENCOUNTER — Ambulatory Visit: Payer: 59 | Admitting: Family Medicine

## 2024-04-19 ENCOUNTER — Encounter: Payer: 59 | Admitting: Nurse Practitioner

## 2024-05-13 ENCOUNTER — Telehealth: Payer: Self-pay | Admitting: Nurse Practitioner

## 2024-05-13 ENCOUNTER — Encounter: Payer: Self-pay | Admitting: Nurse Practitioner

## 2024-05-13 NOTE — Telephone Encounter (Signed)
 04/14/2023 no show 12/26/2023 same day cancel/feeling better 04/19/2024 same day cancel/financial  Final warning sent via mail and mychart

## 2024-06-01 ENCOUNTER — Encounter: Admitting: Nurse Practitioner

## 2024-06-04 ENCOUNTER — Encounter: Admitting: Nurse Practitioner

## 2024-06-22 ENCOUNTER — Ambulatory Visit (INDEPENDENT_AMBULATORY_CARE_PROVIDER_SITE_OTHER): Admitting: Nurse Practitioner

## 2024-06-22 ENCOUNTER — Encounter: Payer: Self-pay | Admitting: Nurse Practitioner

## 2024-06-22 VITALS — BP 118/74 | HR 79 | Temp 98.1°F | Ht 63.0 in | Wt 130.8 lb

## 2024-06-22 DIAGNOSIS — Z1231 Encounter for screening mammogram for malignant neoplasm of breast: Secondary | ICD-10-CM

## 2024-06-22 DIAGNOSIS — Z8041 Family history of malignant neoplasm of ovary: Secondary | ICD-10-CM

## 2024-06-22 DIAGNOSIS — I10 Essential (primary) hypertension: Secondary | ICD-10-CM | POA: Diagnosis not present

## 2024-06-22 DIAGNOSIS — Z0001 Encounter for general adult medical examination with abnormal findings: Secondary | ICD-10-CM | POA: Diagnosis not present

## 2024-06-22 DIAGNOSIS — F3342 Major depressive disorder, recurrent, in full remission: Secondary | ICD-10-CM

## 2024-06-22 DIAGNOSIS — E7800 Pure hypercholesterolemia, unspecified: Secondary | ICD-10-CM

## 2024-06-22 DIAGNOSIS — R7303 Prediabetes: Secondary | ICD-10-CM

## 2024-06-22 DIAGNOSIS — R5383 Other fatigue: Secondary | ICD-10-CM

## 2024-06-22 NOTE — Assessment & Plan Note (Signed)
Repeat lipid panel Advised about need for heart healthy diet and daily exercise

## 2024-06-22 NOTE — Patient Instructions (Addendum)
 Monitor BP in AM and PM x 1week. Send BP readings via mychart in 1week. Decreased amlodipine  dose to 2.5mg  daily if BP remains <120/70 Schedule appointment for mammogram at the front desk Maintain Heart healthy diet and daily exercise. Schedule fasting lab appointment. Ok to drink water

## 2024-06-22 NOTE — Assessment & Plan Note (Signed)
Stable mood with effexor

## 2024-06-22 NOTE — Assessment & Plan Note (Signed)
 Agreed to referral for genetic test counseling

## 2024-06-22 NOTE — Progress Notes (Signed)
 Complete physical exam  Patient: Tiffany Zimmerman   DOB: [birth year]   48 y.o. Female  MRN: [id number] Visit Date: 06/22/2024  Subjective:    Chief Complaint  Patient presents with   Annual Exam   Tiffany Zimmerman is a 48 y.o. female who presents today for a complete physical exam. She reports consuming a general diet. Home exercise routine includes calisthenics and and walking 3x/week. She generally feels well. She reports sleeping well. She does have additional problems to discuss today.  Vision:Yes Dental:No STD Screen:No  BP Readings from Last 3 Encounters:  06/22/24 118/74  04/15/23 118/82  10/01/22 124/80   Wt Readings from Last 3 Encounters:  06/22/24 130 lb 12.8 oz (59.3 kg)  04/15/23 182 lb (82.6 kg)  10/01/22 179 lb 9.6 oz (81.5 kg)    Most recent fall risk assessment:    06/22/2024    1:12 PM  Fall Risk   Falls in the past year? 0  Injury with Fall? 0  Risk for fall due to : No Fall Risks  Follow up Falls evaluation completed     Depression screen:Yes - Depression Most recent depression screenings:    06/22/2024    1:13 PM 04/15/2023   11:58 AM  PHQ 2/9 Scores  PHQ - 2 Score 0 0  PHQ- 9 Score 2 3    HPI  Family history of ovarian cancer Agreed to referral for genetic test counseling  Elevated LDL cholesterol level Repeat lipid panel Advised about need for heart healthy diet and daily exercise  Depression Stable mood with effexor   Primary hypertension BP at goal with amlodipine  BP Readings from Last 3 Encounters:  06/22/24 118/74  04/15/23 118/82  10/01/22 124/80    We discussed need for med dose decease or discontinuation. No home BP readings Advised to Monitor BP in AM and PM x 1week. Send BP readings via mychart in 1week. Decreased amlodipine  dose to 2.5mg  daily if BP remains <120/70  Past Medical History:  Diagnosis Date   Allergy    Anxiety    Atypical squamous cells of undetermined significance on cytologic smear of cervix (ASC-US )  04/15/2023   Cancer (HCC) 05/2013   Melanoma In Situ   Depression    GERD (gastroesophageal reflux disease)    Hypertension    Obesity 10/01/2022   Past Surgical History:  Procedure Laterality Date   DILATION AND CURETTAGE OF UTERUS     TONSILLECTOMY     Social History   Socioeconomic History   Marital status: Divorced    Spouse name: Not on file   Number of children: Not on file   Years of education: Not on file   Highest education level: GED or equivalent  Occupational History   Not on file  Tobacco Use   Smoking status: Never   Smokeless tobacco: Never  Vaping Use   Vaping status: Never Used  Substance and Sexual Activity   Alcohol use: Yes    Alcohol/week: 1.0 standard drink of alcohol    Types: 1 Glasses of wine per week    Comment: occassional   Drug use: Never   Sexual activity: Not Currently    Birth control/protection: Abstinence  Other Topics Concern   Not on file  Social History Narrative   Not on file   Social Drivers of Health   Financial Resource Strain: Low Risk  (06/21/2024)   Overall Financial Resource Strain (CARDIA)    Difficulty of Paying Living Expenses: Not hard at all  Food Insecurity: No Food Insecurity (06/21/2024)   Hunger Vital Sign    Worried About Running Out of Food in the Last Year: Never true    Ran Out of Food in the Last Year: Never true  Transportation Needs: No Transportation Needs (06/21/2024)   PRAPARE - Administrator, Civil Service (Medical): No    Lack of Transportation (Non-Medical): No  Physical Activity: Inactive (06/21/2024)   Exercise Vital Sign    Days of Exercise per Week: 0 days    Minutes of Exercise per Session: Not on file  Stress: No Stress Concern Present (06/21/2024)   [employer] of Occupational Health - Occupational Stress Questionnaire    Feeling of Stress: Only a little  Social Connections: Socially Isolated (06/21/2024)   Social Connection and Isolation Panel    Frequency of  Communication with Friends and Family: Twice a week    Frequency of Social Gatherings with Friends and Family: More than three times a week    Attends Religious Services: Never    Database administrator or Organizations: No    Attends Engineer, structural: Not on file    Marital Status: Divorced  Intimate Partner Violence: Not on file   Family Status  Relation Name Status   Mother Holley Paget Alive   Sister Heather Crandell (Not Specified)   MGM Almarie Milks (Not Specified)   Neg Hx  (Not Specified)  No partnership data on file   Family History  Problem Relation Age of Onset   Arthritis Mother    Cancer Mother 73       ovarian   Anxiety disorder Sister    Depression Sister    Heart disease Maternal Grandmother    Colon cancer Neg Hx    Esophageal cancer Neg Hx    Stomach cancer Neg Hx    Rectal cancer Neg Hx    No Known Allergies  Patient Care Team: Macallister Ashmead, Roselie Rockford, NP as PCP - General (Internal Medicine)   Medications: Outpatient Medications Prior to Visit  Medication Sig   amLODipine  (NORVASC ) 5 MG tablet TAKE ONE TABLET BY MOUTH AT BEDTIME   venlafaxine  XR (EFFEXOR -XR) 150 MG 24 hr capsule TAKE ONE CAPSULE BY MOUTH ONE TIME DAILY WITH BREAKFAST   No facility-administered medications prior to visit.   Review of Systems  Constitutional:  Negative for activity change, appetite change and unexpected weight change.  Respiratory: Negative.    Cardiovascular: Negative.   Gastrointestinal: Negative.   Endocrine: Negative for cold intolerance and heat intolerance.  Genitourinary: Negative.   Musculoskeletal: Negative.   Skin: Negative.   Neurological: Negative.   Hematological: Negative.   Psychiatric/Behavioral:  Negative for behavioral problems, decreased concentration, dysphoric mood, hallucinations, self-injury, sleep disturbance and suicidal ideas. The patient is not nervous/anxious.        Objective:  BP 118/74 (BP Location: Left Arm, Patient  Position: Sitting, Cuff Size: Normal)   Pulse 79   Temp 98.1 F (36.7 C) (Oral)   Ht 5' 3 (1.6 m)   Wt 130 lb 12.8 oz (59.3 kg)   LMP 06/22/2024 (Exact Date)   SpO2 99%   BMI 23.17 kg/m     Physical Exam Vitals and nursing note reviewed.  Constitutional:      General: She is not in acute distress. HENT:     Right Ear: Tympanic membrane, ear canal and external ear normal.     Left Ear: Tympanic membrane, ear canal and external ear normal.  Nose: Nose normal.  Eyes:     Extraocular Movements: Extraocular movements intact.     Conjunctiva/sclera: Conjunctivae normal.     Pupils: Pupils are equal, round, and reactive to light.  Neck:     Thyroid : No thyroid  mass, thyromegaly or thyroid  tenderness.  Cardiovascular:     Rate and Rhythm: Normal rate and regular rhythm.     Pulses: Normal pulses.     Heart sounds: Normal heart sounds.  Pulmonary:     Effort: Pulmonary effort is normal.     Breath sounds: Normal breath sounds.  Abdominal:     General: Bowel sounds are normal.     Palpations: Abdomen is soft.  Musculoskeletal:        General: Normal range of motion.     Cervical back: Normal range of motion and neck supple.     Right lower leg: No edema.     Left lower leg: No edema.  Lymphadenopathy:     Cervical: No cervical adenopathy.  Skin:    General: Skin is warm and dry.  Neurological:     Mental Status: She is alert and oriented to person, place, and time.     Cranial Nerves: No cranial nerve deficit.  Psychiatric:        Mood and Affect: Mood normal.        Behavior: Behavior normal.        Thought Content: Thought content normal.      No results found for any visits on 06/22/24.    Assessment & Plan:    Routine Health Maintenance and Physical Exam  Immunization History  Administered Date(s) Administered   Influenza,inj,Quad PF,6+ Mos 10/01/2022   Influenza-Unspecified 10/10/2021, 10/01/2022   Moderna SARS-COV2 Booster Vaccination 11/26/2020    Moderna Sars-Covid-2 Vaccination 03/09/2020, 04/06/2020   Tdap 11/14/2021    Health Maintenance  Topic Date Due   HIV Screening  Never done   Hepatitis C Screening  Never done   Hepatitis B Vaccines (1 of 3 - 19+ 3-dose series) Never done   INFLUENZA VACCINE  07/09/2024   Cervical Cancer Screening (HPV/Pap Cotest)  04/14/2028   DTaP/Tdap/Td (2 - Td or Tdap) 11/15/2031   Colonoscopy  09/03/2032   HPV VACCINES  Aged Out   Meningococcal B Vaccine  Aged Out   COVID-19 Vaccine  Discontinued    Discussed health benefits of physical activity, and encouraged her to engage in regular exercise appropriate for her age and condition.  Problem List Items Addressed This Visit     Depression (Chronic)   Stable mood with effexor       Elevated LDL cholesterol level   Repeat lipid panel Advised about need for heart healthy diet and daily exercise      Relevant Orders   Lipid panel   Family history of ovarian cancer   Agreed to referral for genetic test counseling      Relevant Orders   Ambulatory referral to Genetics   Primary hypertension   BP at goal with amlodipine  BP Readings from Last 3 Encounters:  06/22/24 118/74  04/15/23 118/82  10/01/22 124/80    We discussed need for med dose decease or discontinuation. No home BP readings Advised to Monitor BP in AM and PM x 1week. Send BP readings via mychart in 1week. Decreased amlodipine  dose to 2.5mg  daily if BP remains <120/70      Other Visit Diagnoses       Encounter for preventative adult health care exam with abnormal findings    -  Primary   Relevant Orders   Comprehensive metabolic panel with GFR     Prediabetes       Relevant Orders   Hemoglobin A1c     Breast cancer screening by mammogram       Relevant Orders   MM 3D SCREENING MAMMOGRAM BILATERAL BREAST     Fatigue, unspecified type       Relevant Orders   CBC   IBC + Ferritin   TSH   VITAMIN D 25 Hydroxy (Vit-D Deficiency, Fractures)      Return in  about 1 year (around 06/22/2025) for CPE (fasting).     Roselie Mood, NP

## 2024-06-22 NOTE — Assessment & Plan Note (Signed)
 BP at goal with amlodipine  BP Readings from Last 3 Encounters:  06/22/24 118/74  04/15/23 118/82  10/01/22 124/80    We discussed need for med dose decease or discontinuation. No home BP readings Advised to Monitor BP in AM and PM x 1week. Send BP readings via mychart in 1week. Decreased amlodipine  dose to 2.5mg  daily if BP remains <120/70

## 2024-06-23 ENCOUNTER — Other Ambulatory Visit: Payer: Self-pay | Admitting: Medical Genetics

## 2024-06-28 ENCOUNTER — Ambulatory Visit

## 2024-07-22 ENCOUNTER — Encounter (HOSPITAL_BASED_OUTPATIENT_CLINIC_OR_DEPARTMENT_OTHER): Payer: Self-pay

## 2024-07-22 ENCOUNTER — Other Ambulatory Visit (INDEPENDENT_AMBULATORY_CARE_PROVIDER_SITE_OTHER)

## 2024-07-22 ENCOUNTER — Ambulatory Visit (HOSPITAL_BASED_OUTPATIENT_CLINIC_OR_DEPARTMENT_OTHER)
Admission: RE | Admit: 2024-07-22 | Discharge: 2024-07-22 | Disposition: A | Discharge status: Home / Self Care | Source: Ambulatory Visit | Attending: Nurse Practitioner | Admitting: Nurse Practitioner

## 2024-07-22 DIAGNOSIS — R7303 Prediabetes: Secondary | ICD-10-CM

## 2024-07-22 DIAGNOSIS — R5383 Other fatigue: Secondary | ICD-10-CM | POA: Diagnosis not present

## 2024-07-22 DIAGNOSIS — Z1231 Encounter for screening mammogram for malignant neoplasm of breast: Secondary | ICD-10-CM | POA: Insufficient documentation

## 2024-07-22 DIAGNOSIS — E7800 Pure hypercholesterolemia, unspecified: Secondary | ICD-10-CM

## 2024-07-22 DIAGNOSIS — Z0001 Encounter for general adult medical examination with abnormal findings: Secondary | ICD-10-CM | POA: Diagnosis not present

## 2024-07-22 LAB — COMPREHENSIVE METABOLIC PANEL WITH GFR
ALT: 15 U/L (ref 0–35)
AST: 16 U/L (ref 0–37)
Albumin: 3.9 g/dL (ref 3.5–5.2)
Alkaline Phosphatase: 48 U/L (ref 39–117)
BUN: 9 mg/dL (ref 6–23)
CO2: 30 meq/L (ref 19–32)
Calcium: 8.6 mg/dL (ref 8.4–10.5)
Chloride: 102 meq/L (ref 96–112)
Creatinine, Ser: 0.58 mg/dL (ref 0.40–1.20)
GFR: 107.36 mL/min (ref >60.00)
Glucose, Bld: 86 mg/dL (ref 70–99)
Potassium: 4 meq/L (ref 3.5–5.1)
Sodium: 138 meq/L (ref 135–145)
Total Bilirubin: 0.3 mg/dL (ref 0.2–1.2)
Total Protein: 6.9 g/dL (ref 6.0–8.3)

## 2024-07-22 LAB — LIPID PANEL
Cholesterol: 225 mg/dL — ABNORMAL HIGH (ref 0–200)
HDL: 60.3 mg/dL (ref >39.00)
LDL Cholesterol: 148 mg/dL — ABNORMAL HIGH (ref 0–99)
NonHDL: 164.33
Total CHOL/HDL Ratio: 4
Triglycerides: 82 mg/dL (ref 0.0–149.0)
VLDL: 16.4 mg/dL (ref 0.0–40.0)

## 2024-07-22 LAB — CBC
HCT: 34 % — ABNORMAL LOW (ref 36.0–46.0)
Hemoglobin: 10.9 g/dL — ABNORMAL LOW (ref 12.0–15.0)
MCHC: 32 g/dL (ref 30.0–36.0)
MCV: 79.1 fl (ref 78.0–100.0)
Platelets: 325 K/uL (ref 150.0–400.0)
RBC: 4.3 Mil/uL (ref 3.87–5.11)
RDW: 18.9 % — ABNORMAL HIGH (ref 11.5–15.5)
WBC: 3.6 K/uL — ABNORMAL LOW (ref 4.0–10.5)

## 2024-07-22 LAB — VITAMIN D 25 HYDROXY (VIT D DEFICIENCY, FRACTURES): VITD: 29.34 ng/mL — ABNORMAL LOW (ref 30.00–100.00)

## 2024-07-22 LAB — IBC + FERRITIN
Ferritin: 4.8 ng/mL — ABNORMAL LOW (ref 10.0–291.0)
Iron: 72 ug/dL (ref 42–145)
Saturation Ratios: 17.8 % — ABNORMAL LOW (ref 20.0–50.0)
TIBC: 404.6 ug/dL (ref 250.0–450.0)
Transferrin: 289 mg/dL (ref 212.0–360.0)

## 2024-07-22 LAB — HEMOGLOBIN A1C: Hgb A1c MFr Bld: 5.5 % (ref 4.6–6.5)

## 2024-07-22 LAB — TSH: TSH: 0.58 u[IU]/mL (ref 0.35–5.50)

## 2024-07-23 ENCOUNTER — Ambulatory Visit: Payer: Self-pay | Admitting: Nurse Practitioner

## 2024-08-27 ENCOUNTER — Ambulatory Visit: Payer: Self-pay

## 2024-08-27 NOTE — Telephone Encounter (Signed)
 Called patient and left a voice message

## 2024-08-27 NOTE — Telephone Encounter (Signed)
 FYI Only or Action Required?: FYI only for provider.  Patient was last seen in primary care on 06/22/2024 by Nche, Roselie Rockford, NP.  Called Nurse Triage reporting Depression.  Symptoms began several weeks ago.  Interventions attempted: Prescription medications: Effexor -XR.  Symptoms are: gradually worsening.  Triage Disposition: See PCP Within 2 Weeks (overriding See Physician Within 24 Hours)  Patient/caregiver understands and will follow disposition?: Yes                             Copied from CRM [id number]. Topic: Clinical - Red Word Triage >> Aug 27, 2024  1:24 PM Rea ORN wrote: Red Word that prompted transfer to Nurse Triage: Pt stated she is on Venlafaxine  for depression and feels like it is not helping. Pt feels more depressed than normal Reason for Disposition  [1] Depression AND [2] getting worse (e.g., sleeping poorly, less able to do activities of daily living)  Answer Assessment - Initial Assessment Questions 1. CONCERN: What happened that made you call today?     Worsening symptoms of depression  2. DEPRESSION SYMPTOM SCREENING: How are you feeling overall? (e.g., decreased energy, increased sleeping or difficulty sleeping, difficulty concentrating, feelings of sadness, guilt, hopelessness, or worthlessness)     Lack of self-discipline, decreased energy, lack of motivation, difficulty getting out of bed in the mornings 3. RISK OF HARM - SUICIDAL IDEATION:  Do you ever have thoughts of hurting or killing yourself?  (e.g., yes, no, no but preoccupation with thoughts about death)     Denies at this time, states she has had fleeting thoughts before, denies plan at this time 4. RISK OF HARM - HOMICIDAL IDEATION:  Do you ever have thoughts of hurting or killing someone else?  (e.g., yes, no, no but preoccupation with thoughts about death)     Denies 5. FUNCTIONAL IMPAIRMENT: How have things been going for you overall? Have you had more  difficulty than usual doing your normal daily activities?  (e.g., better, same, worse; self-care, school, work, interactions)     Getting out of bed and going to work has become more difficult 6. SUPPORT: Who is with you now? Who do you live with? Do you have family or friends who you can talk to?      Lives with her nephew, has friends and family close by 7. THERAPIST: Do you have a counselor or therapist? If Yes, ask: What is their name?     Denies 8. STRESSORS: Has there been any new stress or recent changes in your life?     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 ALCOHOL USE OR SUBSTANCE USE (DRUG USE): Do you drink alcohol or use any illegal drugs?     Denies 10. OTHER: Do you have any other physical symptoms right now? (e.g., fever)     Denies chest pain, denies difficulty breathing, denies additional physical symptoms   Patient stated she has been taking Effexor -XR 150 MG for 2 years. Patient stated she has had to have medications adjusted in the past to help manage symptoms of depression. Scheduled patient for first available appointment with PCP. Advised patient to call back if symptoms worsen, especially if thoughts of SI arise before appointment. Patient verbalized understanding.  Protocols used: Depression-A-AH

## 2024-08-31 NOTE — Telephone Encounter (Signed)
 Called and left a voice message asking to give me a call back at the office at [phone]

## 2024-09-06 ENCOUNTER — Encounter: Payer: Self-pay | Admitting: Nurse Practitioner

## 2024-09-06 ENCOUNTER — Ambulatory Visit: Admitting: Nurse Practitioner

## 2024-09-06 VITALS — BP 122/76 | HR 63 | Temp 97.7°F | Ht 63.0 in | Wt 135.6 lb

## 2024-09-06 DIAGNOSIS — Z23 Encounter for immunization: Secondary | ICD-10-CM

## 2024-09-06 DIAGNOSIS — F331 Major depressive disorder, recurrent, moderate: Secondary | ICD-10-CM | POA: Diagnosis not present

## 2024-09-06 DIAGNOSIS — E7800 Pure hypercholesterolemia, unspecified: Secondary | ICD-10-CM

## 2024-09-06 DIAGNOSIS — N920 Excessive and frequent menstruation with regular cycle: Secondary | ICD-10-CM

## 2024-09-06 MED ORDER — VENLAFAXINE HCL ER 150 MG PO CP24: 300.0000 mg | ORAL_CAPSULE | Freq: Every day | ORAL | 1 refills | Status: AC

## 2024-09-06 NOTE — Assessment & Plan Note (Signed)
 Worsening mood in last 2months, difficulty with loss or interest, lack of enerugy, and easily overwhelmed. Denies any change in lifestyle. Lives with 35yrs old nephew, no tobacco use or ALCOHOL or illicit drug use. No SI/HI/hallucination  Advised to schedule appointment with psychologist. Increase Effexor  dose to 300mg  daily Incorporate daily exercise F/up in 46month

## 2024-09-06 NOTE — Assessment & Plan Note (Addendum)
 regular menstrual cycle with spotting mid cyclex 2days and left pelvic pain. LMP 08/23/2024, 07/19/2024, 06/21/2024 Has medium size clots during cycle, cycle last 7-10days and dysmenorrhea. Report hx of uterine fibroid while in [place]  Use of COC x 53yrs, then stopped. Not sexaully active >1years  Check pelvic US  Advised to maintain OVER THE COUNTER multivitamin with iron 1tab daily

## 2024-09-06 NOTE — Patient Instructions (Addendum)
 Schedule appointment with therapist Increase effexor  dose to 300mg  daily You will be contacted to schedule appointment for pelvic US      Why follow it? Research shows. Those who follow the Mediterranean diet have a reduced risk of heart disease  The diet is associated with a reduced incidence of Parkinson's and Alzheimer's diseases People following the diet may have longer life expectancies and lower rates of chronic diseases  The Dietary Guidelines for Americans recommends the Mediterranean diet as an eating plan to promote health and prevent disease  What Is the Mediterranean Diet?  Healthy eating plan based on typical foods and recipes of Mediterranean-style cooking The diet is primarily a plant based diet; these foods should make up a majority of meals   Starches - Plant based foods should make up a majority of meals - They are an important sources of vitamins, minerals, energy, antioxidants, and fiber - Choose whole grains, foods high in fiber and minimally processed items  - Typical grain sources include wheat, oats, barley, corn, brown rice, bulgar, farro, millet, polenta, couscous  - Various types of beans include chickpeas, lentils, fava beans, black beans, white beans   Fruits  Veggies - Large quantities of antioxidant rich fruits & veggies; 6 or more servings  - Vegetables can be eaten raw or lightly drizzled with oil and cooked  - Vegetables common to the traditional Mediterranean Diet include: artichokes, arugula, beets, broccoli, brussel sprouts, cabbage, carrots, celery, collard greens, cucumbers, eggplant, kale, leeks, lemons, lettuce, mushrooms, okra, onions, peas, peppers, potatoes, pumpkin, radishes, rutabaga, shallots, spinach, sweet potatoes, turnips, zucchini - Fruits common to the Mediterranean Diet include: apples, apricots, avocados, cherries, clementines, dates, figs, grapefruits, grapes, melons, nectarines, oranges, peaches, pears, pomegranates, strawberries,  tangerines  Fats - Replace butter and margarine with healthy oils, such as olive oil, canola oil, and tahini  - Limit nuts to no more than a handful a day  - Nuts include walnuts, almonds, pecans, pistachios, pine nuts  - Limit or avoid candied, honey roasted or heavily salted nuts - Olives are central to the [employer] - can be eaten whole or used in a variety of dishes   Meats Protein - Limiting red meat: no more than a few times a month - When eating red meat: choose lean cuts and keep the portion to the size of deck of cards - Eggs: approx. 0 to 4 times a week  - Fish and lean poultry: at least 2 a week  - Healthy protein sources include, chicken, [place], lean beef, lamb - Increase intake of seafood such as tuna, salmon, trout, mackerel, shrimp, scallops - Avoid or limit high fat processed meats such as sausage and bacon  Dairy - Include moderate amounts of low fat dairy products  - Focus on healthy dairy such as fat free yogurt, skim milk, low or reduced fat cheese - Limit dairy products higher in fat such as whole or 2% milk, cheese, ice cream  Alcohol - Moderate amounts of red wine is ok  - No more than 5 oz daily for women (all ages) and men older than age 19  - No more than 10 oz of wine daily for men younger than 59  Other - Limit sweets and other desserts  - Use herbs and spices instead of salt to flavor foods  - Herbs and spices common to the traditional Mediterranean Diet include: basil, bay leaves, chives, cloves, cumin, fennel, garlic, lavender, marjoram, mint, oregano, parsley, pepper, rosemary, sage, savory, sumac,  tarragon, thyme   It's not just a diet, it's a lifestyle:  The Mediterranean diet includes lifestyle factors typical of those in the region  Foods, drinks and meals are best eaten with others and savored Daily physical activity is important for overall good health This could be strenuous exercise like running and aerobics This could also be more  leisurely activities such as walking, housework, yard-work, or taking the stairs Moderation is the key; a balanced and healthy diet accommodates most foods and drinks Consider portion sizes and frequency of consumption of certain foods   Meal Ideas & Options:  Breakfast:  Whole wheat toast or whole wheat English muffins with peanut butter & hard boiled egg Steel cut oats topped with apples & cinnamon and skim milk  Fresh fruit: banana, strawberries, melon, berries, peaches  Smoothies: strawberries, bananas, greek yogurt, peanut butter Low fat greek yogurt with blueberries and granola  Egg white omelet with spinach and mushrooms Breakfast couscous: whole wheat couscous, apricots, skim milk, cranberries  Sandwiches:  Hummus and grilled vegetables (peppers, zucchini, squash) on whole wheat bread   Grilled chicken on whole wheat pita with lettuce, tomatoes, cucumbers or tzatziki  [place] salad on whole wheat bread: tuna salad made with greek yogurt, olives, red peppers, capers, green onions Garlic rosemary lamb pita: lamb sauted with garlic, rosemary, salt & pepper; add lettuce, cucumber, greek yogurt to pita - flavor with lemon juice and black pepper  Seafood:  Mediterranean grilled salmon, seasoned with garlic, basil, parsley, lemon juice and black pepper Shrimp, lemon, and spinach whole-grain pasta salad made with low fat greek yogurt  Seared scallops with lemon orzo  Seared tuna steaks seasoned salt, pepper, coriander topped with tomato mixture of olives, tomatoes, olive oil, minced garlic, parsley, green onions and cappers  Meats:  Herbed greek chicken salad with kalamata olives, cucumber, feta  Red bell peppers stuffed with spinach, bulgur, lean ground beef (or lentils) & topped with feta   Kebabs: skewers of chicken, tomatoes, onions, zucchini, squash  [place] burgers: made with red onions, mint, dill, lemon juice, feta cheese topped with roasted red peppers Vegetarian Cucumber salad:  cucumbers, artichoke hearts, celery, red onion, feta cheese, tossed in olive oil & lemon juice  Hummus and whole grain pita points with a greek salad (lettuce, tomato, feta, olives, cucumbers, red onion) Lentil soup with celery, carrots made with vegetable broth, garlic, salt and pepper  Tabouli salad: parsley, bulgur, mint, scallions, cucumbers, tomato, radishes, lemon juice, olive oil, salt and pepper.

## 2024-09-06 NOTE — Progress Notes (Signed)
 Established Patient Visit  Patient: Tiffany Zimmerman   DOB: 01-14-1976   48 y.o. Female  MRN: [id number] Visit Date: 09/06/2024  Subjective:    Chief Complaint  Patient presents with   Depression    Worsen depression  Discuss Cholesterol and menstrual cycle     Depression Worsening mood in last 2months, difficulty with loss or interest, lack of enerugy, and easily overwhelmed. Denies any change in lifestyle. Lives with 54yrs old nephew, no tobacco use or ALCOHOL or illicit drug use. No SI/HI/hallucination  Advised to schedule appointment with psychologist. Increase Effexor  dose to 300mg  daily Incorporate daily exercise F/up in 70month  Menorrhagia with regular cycle regular menstrual cycle with spotting mid cyclex 2days and left pelvic pain. LMP 08/23/2024, 07/19/2024, 06/21/2024 Has medium size clots during cycle, cycle last 7-10days and dysmenorrhea. Report hx of uterine fibroid while in [place]  Use of COC x 27yrs, then stopped. Not sexaully active >1years  Check pelvic US  Advised to maintain OVER THE COUNTER multivitamin with iron 1tab daily  Elevated LDL cholesterol level Encouraged to maintain mediterrean diet Repeat lipid panel in 3months (fasting) Reviewed medical, surgical, and social history today  Medications: Outpatient Medications Prior to Visit  Medication Sig   amLODipine  (NORVASC ) 5 MG tablet TAKE ONE TABLET BY MOUTH AT BEDTIME   Cholecalciferol (VITAMIN D3) 50 MCG (2000 UT) CAPS Take 1 capsule by mouth daily.   Multiple Vitamin (MULTIVITAMIN ADULT PO) Take 1 tablet by mouth daily in the afternoon.   tirzepatide (ZEPBOUND) 7.5 MG/0.5ML Pen Inject 7.5 mg into the skin once a week.   [DISCONTINUED] venlafaxine  XR (EFFEXOR -XR) 150 MG 24 hr capsule TAKE ONE CAPSULE BY MOUTH ONE TIME DAILY WITH BREAKFAST   No facility-administered medications prior to visit.   Reviewed past medical and social history.   ROS per HPI above  Last CBC Lab Results   Component Value Date   WBC 3.6 (L) 07/22/2024   HGB 10.9 (L) 07/22/2024   HCT 34.0 (L) 07/22/2024   MCV 79.1 07/22/2024   MCH 30.3 10/25/2021   RDW 18.9 (H) 07/22/2024   PLT 325.0 07/22/2024   Last metabolic panel Lab Results  Component Value Date   GLUCOSE 86 07/22/2024   NA 138 07/22/2024   K 4.0 07/22/2024   CL 102 07/22/2024   CO2 30 07/22/2024   BUN 9 07/22/2024   CREATININE 0.58 07/22/2024   GFR 107.36 07/22/2024   CALCIUM 8.6 07/22/2024   PROT 6.9 07/22/2024   ALBUMIN 3.9 07/22/2024   BILITOT 0.3 07/22/2024   ALKPHOS 48 07/22/2024   AST 16 07/22/2024   ALT 15 07/22/2024   ANIONGAP 5 10/25/2021   Last thyroid  functions Lab Results  Component Value Date   TSH 0.58 07/22/2024        Objective:  BP 122/76 (BP Location: Left Arm, Patient Position: Sitting, Cuff Size: Normal)   Pulse 63   Temp 97.7 F (36.5 C) (Oral)   Ht 5' 3 (1.6 m)   Wt 135 lb 9.6 oz (61.5 kg)   LMP 08/23/2024   SpO2 98%   BMI 24.02 kg/m      Physical Exam Vitals reviewed.  Constitutional:      General: She is not in acute distress. Cardiovascular:     Rate and Rhythm: Normal rate.     Pulses: Normal pulses.  Pulmonary:     Effort: Pulmonary effort is normal.  Neurological:  Mental Status: She is alert and oriented to person, place, and time.  Psychiatric:        Mood and Affect: Mood normal.        Behavior: Behavior normal.        Thought Content: Thought content normal.        Judgment: Judgment normal.     No results found for any visits on 09/06/24.    Assessment & Plan:    Problem List Items Addressed This Visit     Depression - Primary (Chronic)   Worsening mood in last 2months, difficulty with loss or interest, lack of enerugy, and easily overwhelmed. Denies any change in lifestyle. Lives with 86yrs old nephew, no tobacco use or ALCOHOL or illicit drug use. No SI/HI/hallucination  Advised to schedule appointment with psychologist. Increase Effexor  dose  to 300mg  daily Incorporate daily exercise F/up in 32month      Relevant Medications   venlafaxine  XR (EFFEXOR -XR) 150 MG 24 hr capsule   Elevated LDL cholesterol level   Encouraged to maintain mediterrean diet Repeat lipid panel in 3months (fasting)      Menorrhagia with regular cycle   regular menstrual cycle with spotting mid cyclex 2days and left pelvic pain. LMP 08/23/2024, 07/19/2024, 06/21/2024 Has medium size clots during cycle, cycle last 7-10days and dysmenorrhea. Report hx of uterine fibroid while in [place]  Use of COC x 20yrs, then stopped. Not sexaully active >1years  Check pelvic US  Advised to maintain OVER THE COUNTER multivitamin with iron 1tab daily      Relevant Orders   US  Pelvic Complete With Transvaginal   Other Visit Diagnoses       Need for influenza vaccination       Relevant Orders   Flu vaccine trivalent PF, 6mos and older(Flulaval,Afluria,Fluarix,Fluzone) (Completed)      Return in about 4 weeks (around 10/04/2024) for depression and anxiety.     Roselie Mood, NP

## 2024-09-06 NOTE — Assessment & Plan Note (Signed)
 Encouraged to maintain mediterrean diet Repeat lipid panel in 3months (fasting)

## 2024-09-10 ENCOUNTER — Other Ambulatory Visit (HOSPITAL_COMMUNITY)
Admission: RE | Admit: 2024-09-10 | Discharge: 2024-09-10 | Disposition: A | Discharge status: Home / Self Care | Payer: Self-pay | Source: Ambulatory Visit | Attending: Medical Genetics | Admitting: Medical Genetics

## 2024-09-20 LAB — GENECONNECT MOLECULAR SCREEN: Genetic Analysis Overall Interpretation: NEGATIVE

## 2024-10-06 ENCOUNTER — Ambulatory Visit: Admitting: Nurse Practitioner

## 2024-10-26 ENCOUNTER — Ambulatory Visit: Payer: Self-pay

## 2024-10-26 NOTE — Telephone Encounter (Signed)
 FYI Only or Action Required?: FYI only for provider: appointment scheduled on 11/10/24, added to wait list.  Patient was last seen in primary care on 09/06/2024 by Nche, Roselie Rockford, NP.  Called Nurse Triage reporting Menstrual Problem.  Symptoms began chronic.  Interventions attempted: Nothing.  Symptoms are: unchanged.  Triage Disposition: See PCP Within 2 Weeks  Patient/caregiver understands and will follow disposition?: Yes, but will wait    Copied from CRM #[id number]. Topic: Clinical - Red Word Triage >> Oct 26, 2024  4:38 PM Rea ORN wrote: Red Word that prompted transfer to Nurse Triage: Heavy bleeding and painful menstrual cycles. Pt stated she had a fibroid in the past. Last cycle 11/9 Reason for Disposition  Heavier than normal periods (e.g., doubling up on pads to prevent leaking, needing to change pads overnight, unable to do normal activities)  Answer Assessment - Initial Assessment Questions Was evaluated in September, she was ordered an ultrasound that she has not scheduled yet, contact info provided by [facility] prior to transfer to nurse triage, patient plans to call and schedule ultrasound. Request appointment for evaluation and management of symptoms. Scheduled on December 3 with pcp, added to wait list for sooner appointment.     1. BLEEDING SEVERITY: Describe the bleeding that you are having. How much bleeding is there?      Heavy-changes tampons 1-2 times per hour 2. ONSET: When did the bleeding begin? Is it continuing now?     Last cycle 10/17/24 for 7 days 3. MENSTRUAL PERIOD: When was the last normal menstrual period? How is this different than your period?     10/17/24-increased painful cramping 4. REGULARITY: How regular are your periods?     No change 5. ABDOMEN PAIN: Do you have any pain? How bad is the pain?  (e.g., Scale 0-10; none, mild, moderate, or severe)     Cramping is worsening, No current pain 6. PREGNANCY: Is there any chance you  are pregnant? When was your last menstrual period?     denies 7. BREASTFEEDING: Are you breastfeeding?      8. HORMONE MEDICINES: Are you taking any hormone medicines, prescription or over-the-counter? (e.g., birth control pills, estrogen)      9. BLOOD THINNER MEDICINES: Do you take any blood thinners? (e.g., Coumadin / warfarin, Pradaxa / dabigatran, aspirin)      10. CAUSE: What do you think is causing the bleeding? (e.g., recent gyn surgery, recent gyn procedure; known bleeding disorder, cervical cancer, polycystic ovarian disease, fibroids)         Unsure history of fibroid-was on ocp thinks she may have fibroids again.  11. HEMODYNAMIC STATUS: Are you weak or feeling lightheaded? If Yes, ask: Can you stand and walk normally?        Denies  12. OTHER SYMPTOMS: What other symptoms are you having with the bleeding? (e.g., passed tissue, vaginal discharge, fever, menstrual-type cramps)       Denies  Protocols used: Vaginal Bleeding - Abnormal-A-AH

## 2024-11-10 ENCOUNTER — Ambulatory Visit: Admitting: Nurse Practitioner

## 2025-02-07 ENCOUNTER — Ambulatory Visit: Admitting: Nurse Practitioner

## 2025-06-23 ENCOUNTER — Encounter: Admitting: Nurse Practitioner
